# Patient Record
Sex: Female | Born: 1950
Health system: Southern US, Community
[De-identification: ages and names within clinical notes are randomized; demographics above are authoritative.]

## PROBLEM LIST (undated history)

## (undated) DIAGNOSIS — R51 Headache: Secondary | ICD-10-CM

## (undated) DIAGNOSIS — I10 Essential (primary) hypertension: Secondary | ICD-10-CM

## (undated) DIAGNOSIS — D126 Benign neoplasm of colon, unspecified: Secondary | ICD-10-CM

## (undated) DIAGNOSIS — K6289 Other specified diseases of anus and rectum: Secondary | ICD-10-CM

## (undated) DIAGNOSIS — G6289 Other specified polyneuropathies: Secondary | ICD-10-CM

## (undated) DIAGNOSIS — K5790 Diverticulosis of intestine, part unspecified, without perforation or abscess without bleeding: Secondary | ICD-10-CM

## (undated) DIAGNOSIS — T7840XA Allergy, unspecified, initial encounter: Secondary | ICD-10-CM

## (undated) DIAGNOSIS — E785 Hyperlipidemia, unspecified: Secondary | ICD-10-CM

## (undated) HISTORY — DX: Headache: R51

## (undated) HISTORY — DX: Hyperlipidemia, unspecified: E78.5

## (undated) HISTORY — DX: Diverticulosis of intestine, part unspecified, without perforation or abscess without bleeding: K57.90

## (undated) HISTORY — DX: Allergy, unspecified, initial encounter: T78.40XA

## (undated) HISTORY — DX: Other specified diseases of anus and rectum: K62.89

## (undated) HISTORY — DX: Benign neoplasm of colon, unspecified: D12.6

## (undated) HISTORY — DX: Essential (primary) hypertension: I10

---
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
## 1998-06-29 ENCOUNTER — Emergency Department (HOSPITAL_COMMUNITY): Admission: EM | Admit: 1998-06-29 | Discharge: 1998-06-29 | Payer: Self-pay | Admitting: Emergency Medicine

## 1998-07-08 DIAGNOSIS — K6289 Other specified diseases of anus and rectum: Secondary | ICD-10-CM

## 1998-07-08 HISTORY — DX: Other specified diseases of anus and rectum: K62.89

## 1999-08-03 ENCOUNTER — Other Ambulatory Visit: Admission: RE | Admit: 1999-08-03 | Discharge: 1999-08-03 | Payer: Self-pay

## 2000-10-06 ENCOUNTER — Other Ambulatory Visit: Admission: RE | Admit: 2000-10-06 | Discharge: 2000-10-06 | Payer: Self-pay | Admitting: Obstetrics and Gynecology

## 2002-11-01 ENCOUNTER — Other Ambulatory Visit: Admission: RE | Admit: 2002-11-01 | Discharge: 2002-11-01 | Payer: Self-pay | Admitting: Obstetrics and Gynecology

## 2003-11-04 ENCOUNTER — Other Ambulatory Visit: Admission: RE | Admit: 2003-11-04 | Discharge: 2003-11-04 | Payer: Self-pay | Admitting: Obstetrics and Gynecology

## 2004-10-12 ENCOUNTER — Encounter: Admission: RE | Admit: 2004-10-12 | Discharge: 2004-10-12 | Payer: Self-pay | Admitting: Family Medicine

## 2004-12-14 ENCOUNTER — Encounter: Admission: RE | Admit: 2004-12-14 | Discharge: 2004-12-14 | Payer: Self-pay | Admitting: Neurology

## 2005-01-10 ENCOUNTER — Other Ambulatory Visit: Admission: RE | Admit: 2005-01-10 | Discharge: 2005-01-10 | Payer: Self-pay | Admitting: Obstetrics and Gynecology

## 2005-01-13 ENCOUNTER — Encounter: Admission: RE | Admit: 2005-01-13 | Discharge: 2005-01-13 | Payer: Self-pay | Admitting: Neurology

## 2005-06-21 ENCOUNTER — Ambulatory Visit: Payer: Self-pay | Admitting: Internal Medicine

## 2005-08-02 ENCOUNTER — Ambulatory Visit: Payer: Self-pay | Admitting: Internal Medicine

## 2005-08-12 ENCOUNTER — Ambulatory Visit: Payer: Self-pay | Admitting: Internal Medicine

## 2005-11-23 ENCOUNTER — Encounter: Admission: RE | Admit: 2005-11-23 | Discharge: 2005-12-15 | Payer: Self-pay | Admitting: Neurology

## 2007-10-04 HISTORY — PX: ABDOMINAL HYSTERECTOMY: SHX81

## 2007-10-04 HISTORY — PX: BLADDER SURGERY: SHX569

## 2008-08-20 ENCOUNTER — Ambulatory Visit: Payer: Self-pay | Admitting: Sports Medicine

## 2008-08-20 DIAGNOSIS — M5137 Other intervertebral disc degeneration, lumbosacral region: Secondary | ICD-10-CM | POA: Insufficient documentation

## 2008-08-20 DIAGNOSIS — M51379 Other intervertebral disc degeneration, lumbosacral region without mention of lumbar back pain or lower extremity pain: Secondary | ICD-10-CM | POA: Insufficient documentation

## 2008-08-20 DIAGNOSIS — M79609 Pain in unspecified limb: Secondary | ICD-10-CM | POA: Insufficient documentation

## 2008-08-20 DIAGNOSIS — M5430 Sciatica, unspecified side: Secondary | ICD-10-CM | POA: Insufficient documentation

## 2008-08-21 ENCOUNTER — Encounter: Payer: Self-pay | Admitting: Sports Medicine

## 2008-09-16 ENCOUNTER — Ambulatory Visit: Payer: Self-pay | Admitting: Sports Medicine

## 2008-09-16 DIAGNOSIS — M25519 Pain in unspecified shoulder: Secondary | ICD-10-CM | POA: Insufficient documentation

## 2008-10-09 ENCOUNTER — Telehealth (INDEPENDENT_AMBULATORY_CARE_PROVIDER_SITE_OTHER): Payer: Self-pay | Admitting: *Deleted

## 2008-10-30 ENCOUNTER — Ambulatory Visit: Payer: Self-pay | Admitting: Sports Medicine

## 2009-04-08 ENCOUNTER — Ambulatory Visit (HOSPITAL_COMMUNITY): Admission: RE | Admit: 2009-04-08 | Discharge: 2009-04-09 | Payer: Self-pay | Admitting: Obstetrics and Gynecology

## 2009-04-08 ENCOUNTER — Encounter (INDEPENDENT_AMBULATORY_CARE_PROVIDER_SITE_OTHER): Payer: Self-pay | Admitting: Obstetrics and Gynecology

## 2010-06-29 ENCOUNTER — Encounter (INDEPENDENT_AMBULATORY_CARE_PROVIDER_SITE_OTHER): Payer: Self-pay | Admitting: *Deleted

## 2010-10-24 ENCOUNTER — Encounter: Payer: Self-pay | Admitting: Neurology

## 2010-11-02 NOTE — Letter (Signed)
Summary: Colonoscopy Date Change Letter  [facility]  [address] [place], [place] [address]   Phone: [phone]  Fax: [phone]      June 29, 2010 MRN: [id number]   [facility] [address] [place], [place]  [address]   Dear Ms. Herrero,   Previously you were recommended to have a repeat colonoscopy around this time. Your chart was recently reviewed by Dr. Lina Sar of [facility]. Follow up colonoscopy is now recommended in November 2013. This revised recommendation is based on current, nationally recognized guidelines for colorectal cancer screening and polyp surveillance. These guidelines are endorsed by the American Cancer Society, [employer] on Colorectal Cancer as well as numerous other major medical organizations.  Please understand that our recommendation assumes that you do not have any new symptoms such as bleeding, a change in bowel habits, anemia, or significant abdominal discomfort. If you do have any concerning GI symptoms or want to discuss the guideline recommendations, please call to arrange an office visit at your earliest convenience. Otherwise we will keep you in our reminder system and contact you 1-2 months prior to the date listed above to schedule your next colonoscopy.  Thank you,  Hedwig Morton. Juanda Chance, M.D.  [facility] [phone]

## 2011-01-09 LAB — CBC
HCT: 45.2 % (ref 36.0–46.0)
Hemoglobin: 15.7 g/dL — ABNORMAL HIGH (ref 12.0–15.0)
MCHC: 34.5 g/dL (ref 30.0–36.0)
MCHC: 34.7 g/dL (ref 30.0–36.0)
MCV: 88 fL (ref 78.0–100.0)
MCV: 89.4 fL (ref 78.0–100.0)
RBC: 5.13 MIL/uL — ABNORMAL HIGH (ref 3.87–5.11)
RDW: 12.4 % (ref 11.5–15.5)

## 2011-01-09 LAB — BASIC METABOLIC PANEL
CO2: 28 mEq/L (ref 19–32)
CO2: 29 mEq/L (ref 19–32)
Calcium: 9 mg/dL (ref 8.4–10.5)
Chloride: 105 mEq/L (ref 96–112)
Chloride: 97 mEq/L (ref 96–112)
Creatinine, Ser: 0.79 mg/dL (ref 0.4–1.2)
Creatinine, Ser: 0.86 mg/dL (ref 0.4–1.2)
GFR calc Af Amer: >60 mL/min (ref >60)
Glucose, Bld: 117 mg/dL — ABNORMAL HIGH (ref 70–99)
Potassium: 3.3 mEq/L — ABNORMAL LOW (ref 3.5–5.1)
Sodium: 134 mEq/L — ABNORMAL LOW (ref 135–145)

## 2011-02-15 NOTE — Op Note (Signed)
NAMEJOURY, ALLCORN               ACCOUNT NO.:  [id number]   MEDICAL RECORD NO.:  [id number]          PATIENT TYPE:  INP   LOCATION:  9315                          FACILITY:  [facility]   PHYSICIAN:  Malva Limes, M.D.    DATE OF BIRTH:  05-Mar-1951   DATE OF PROCEDURE:  04/08/2009  DATE OF DISCHARGE:                               OPERATIVE REPORT   PREOPERATIVE DIAGNOSES:  1. Uterine prolapse.  2. Uterine fibroids.  3. Symptomatic cystocele and rectocele.  4. Stress urinary incontinence.   POSTOPERATIVE DIAGNOSES:  1. Uterine prolapse.  2. Uterine fibroids.  3. Symptomatic cystocele and rectocele.  4. Stress urinary incontinence.   PROCEDURE:  Laparoscopic-assisted total vaginal hysterectomy with  bilateral salpingo-oophorectomy, TVT sling placement, cystoscopy,  anterior and posterior colporrhaphy.   SURGEON:  Malva Limes, MD   ASSISTANT:  Edward Jolly.   ANESTHESIA:  General endotracheal.   ANTIBIOTICS:  Cefotan 1 g.   ESTIMATED BLOOD LOSS:  200 mL.   COMPLICATIONS:  None.   SPECIMENS:  Uterus, cervix, fallopian tubes, and ovaries sent to  Pathology.   DRAINS:  Foley at bedside drainage.   PROCEDURE:  The patient was taken to the operating room where general  anesthetic was administered without difficulty.  The patient was then  placed in the dorsal lithotomy position.  She was prepped and draped in  the usual fashion for this procedure.  A Hulka tenaculum was applied to  the anterior cervical lip.  At this point, the umbilicus was injected  with 0.25% Marcaine.  A vertical skin incision was made.  The fascia was  then grasped and entered with the Mayo scissors.  Parietal peritoneum  was entered bluntly.  A 0 Vicryl suture was placed in the pursestring  fashion.  The Hasson cannula was placed into the abdominal cavity.  A 3  L of carbon dioxide was then insufflated.  The patient was placed in  Trendelenburg, and the scope placed.  A 5-mm port was placed in the  right and  left lower quadrant under direct visualization.  At this  point, an examination revealed normal-appearing bowel.  The patient had  no evidence of any endometriosis or adhesions.  The patient did have a  multilobulated uterus consistent with uterine fibroids.  Both ovaries  were normal bilaterally.  At this point, the left ureter was identified.  The infundibulopelvic ligament was then grasped, cauterized, and  transected with the Gyrus device.  The round ligament was cauterized and  transected, and then the broad ligament was cauterized and transected  down to the level of the uterine vessel.  The bladder flap was then  opened.  Similar procedure was performed in the opposite side.  Once  this was completed, the attention was turned to the vagina.  The cervix  was injected with 0.25% Marcaine.  The posterior cul-de-sac was entered  sharply.  The uterosacral ligaments were bilaterally clamped, cut, and  ligated with 0 Monocryl suture.  The cervix was then circumscribed.  The  anterior cul-de-sac was entered sharply.  The cardinal ligaments were  bilaterally clamped, cut, and ligated  with 0 Monocryl suture.  The  uterine vessels were bilaterally clamped, cut, and ligated with 0  Monocryl suture.  The uterus was then inverted, and the remaining  pedicles clamped, cut, and ligated with 0 Monocryl suture.  The uterus,  fallopian tubes, and ovaries were then removed.  All pedicles were then  checked and felt to be hemostatic.  At this point, the posterior cuff  was closed using 2-0 Vicryl in a running locking fashion.  At this  point, Dr. Edward Jolly took over the case, and she performed the TVT sling  placement, cystoscopy, anterior and posterior colporrhaphy.  Once these  procedures were completed, attention was again turned to the  laparoscopy.  The abdomen was reinsufflated, and no evidence of any  bleeding noted.  The scope was then removed.  The pneumoperitoneum  released.  The fascia was closed  with 0 Monocryl suture, the skin with 4-  0 Vicryl suture.  The 5-mm ports were closed with Dermabond.  The  patient was extubated and taken to the recovery room in stable  condition.  Instrument and lap counts were correct x2.   For complete description of the procedure performed by Dr. Edward Jolly, please  see her dictated operative note.           ______________________________  Malva Limes, M.D.     MA/MEDQ  D:  04/08/2009  T:  04/09/2009  Job:  [id number]

## 2011-02-15 NOTE — Op Note (Signed)
[name], [name]               ACCOUNT NO.:  [id number]   MEDICAL RECORD NO.:  [id number]          PATIENT TYPE:  INP   LOCATION:  9315                          FACILITY:  [facility]   PHYSICIAN:  [name], M.D.   DATE OF BIRTH:  03-13-1951   DATE OF PROCEDURE:  04/08/2009  DATE OF DISCHARGE:                               OPERATIVE REPORT   PREOPERATIVE DIAGNOSES:  1. Incomplete uterovaginal prolapse.  2. Genuine stress incontinence.   POSTOPERATIVE DIAGNOSES:  1. Incomplete uterovaginal prolapse.  2. Genuine stress incontinence.   SURGEON:  Anna Lobo, MD   ASSISTANT:  Anna Limes, MD   PROCEDURE:  Durwin Nora, tension-free vaginal tape, mid-urethral  sling, cystoscopy, anterior and posterior colporrhaphy.   ANESTHESIA:  General endotracheal, local with 1% lidocaine with  epinephrine 1:100,000.   TOTAL URINARY OUTPUT FOR PROCEDURE:  800 mL.   TOTAL ESTIMATED BLOOD LOSS:  200 mL.   TOTAL IV FLUIDS:  2800 mL.   COMPLICATIONS:  None.   INDICATIONS FOR PROCEDURE:  The patient is a 60 year old gravida 2, para  1-0-1-1 Caucasian female referred by Dr. Malva Le for evaluation  and treatment of pelvic organ prolapse and urinary incontinence.  The  patient in the office was noted to have a third-degree cystocele, first-  degree uterine prolapse, and first to second-degree rectocele.  The  patient reported urinary loss with sneezing, coughing, and stooping.  The the patient had multichannel urodynamic testing in the office that  did not show genuine stress incontinence.  The patient has requested  surgery for both prolapse and the potential genuine stress incontinence.  A plan is now made to proceed with a laparoscopically-assisted vaginal  hysterectomy with bilateral salpingo-oophorectomy by Dr. Dareen Le and to  then proceed with a TVT midurethral sling, cystoscopy, and anterior and  posterior colporrhaphy by me.  Risks, benefits, and alternatives have  been reviewed with the patient who wishes to proceed.   FINDINGS:  Examination under anesthesia revealed a third-degree  cystocele, first-degree uterine prolapse, and a first to second-degree  rectocele.  Cystoscopy demonstrated the bladder to be normal throughout  360 degrees including the bladder dome and trigone.  The ureters were  patent bilaterally.  There was no evidence of foreign body in the  bladder or the urethra.   SPECIMENS:  None.   DESCRIPTION OF PROCEDURE:  The patient was reidentified in the  preoperative hold area.  She did receive cefotetan 1 g IV for antibiotic  prophylaxis and compression stockings for deep vein thrombosis  prophylaxis.   In the operating room, the patient received general endotracheal  anesthesia and was then placed in the dorsal lithotomy position.  Dr.  Dareen Le prepped and draped the patient and placed a Foley catheter and  then proceeded with a laparoscopically-assisted vaginal hysterectomy  with bilateral salpingo-oophorectomy.  Please refer to this dictation  separately.   At the termination of the hysterectomy, Dr. Dareen Le had placed a  running locked suture in the posterior vaginal cuff.  The pedicles were  noted to be hemostatic.   I then took over at this  time and performed a McCall culdoplasty suture.  I used a 0 Vicryl suture to come through the vaginal cuff and into the  posterior cul-de-sac at the 6 o'clock position.  The suture was brought  down through the distal left uterosacral ligament, across the posterior  cul-de-sac in a pursestring fashion, down through the distal right  uterosacral ligament and then out through the cul-de-sac and into the  vagina at the 6 o'clock position.   The anterior colporrhaphy was performed next.  Allis clamps were used to  mark the midline of the anterior vaginal mucosa which was injected  locally with 1% lidocaine with epinephrine 1:100,000.  The vaginal  mucosa was then incised vertically  in the midline.  The subvaginal  tissue and bladder were dissected off the overlying vaginal mucosa  bilaterally.  The dissection was carried back to the pubic rami and then  down to the level of the uterosacral ligament sutures.   The sling was performed next.  Two 1 cm suprapubic incisions were  created sharply with a scalpel.  They were placed 2 cm to the right and  left in the midline.  The sling was performed in a top-down fashion.   An abdominal needle passer was placed through the right suprapubic  incision and then out through the endopelvic fascia in the vagina  lateral to the mid urethra on the ipsilateral side.  The same procedure  that was performed on the right was then repeated on the left-hand side.  The Foley catheter was taken out at this time and cystoscopy was  performed and there was no evidence of a foreign body in either the  bladder or the urethra.  The left ureter was noted to be patent after  the injection of indigo carmine dye IV and then 10 mg of Lasix.  The  Lasix was used as there was a significant delay in the appearance of the  indigo carmine dye.  The right ureter did not demonstrate patency at  this time and the bladder was therefore drained and the sling procedure  was continuedto give additional time for the right ureter to demonstrate  function.  The Foley catheter was replaced.  The sling was attached to  the abdominal needle passers and was drawn up through the suprapubic  incisions bilaterally.  The plastic sheaths were separated from the  surrounding sling.  A Kelly clamp was placed between the urethra and the  sling as the plastic sheaths were then removed.  Excess sling was  trimmed suprapubically.  The sling was noted to be in excellent  condition.   The Foley catheter was again removed and cystoscopy was performed again,  and this time there was patency demonstrated of the right ureter.  The  cystoscopic fluid was therefore drained and the  Foley catheter was  replaced and left to continuous drainage throughout the remainder of the  procedure.   The anterior colporrhaphy was performed by placing vertical mattress  sutures of 2-0 Vicryl for reduction of the cystocele.   Excess vaginal mucosa was then trimmed and the anterior vaginal wall was  closed with a running locked suture of 2-0 Vicryl.  The vaginal cuff was  then closed with a running locked suture of 0 Vicryl.  Hemostasis was  good.   The posterior colporrhaphy was performed last.  Allis clamps were used  to mark the introitus and the posterior vaginal mucosa.  The perineal  body and the posterior vaginal mucosa were  then injected locally with 1%  lidocaine with epinephrine 1:100,000.  A triangular wedge of epithelium  was excised from the perineal body and then the posterior vaginal mucosa  was incised vertically in the midline.  The perirectal fascia was  dissected off the overlying vaginal mucosa.  Hemostasis was created with  monopolar cautery.  The colporrhaphy was performed next.  The top suture  was a pursestring suture that did involve the vaginal mucosa in the  midline.  The other colporrhaphy stitches were vertical mattress sutures  of 2-0 Vicryl, which transitioned down to 0 Vicryl at the perineal body.  Hemostasis was created also with monopolar cautery.  Excess vaginal  mucosa was trimmed and posterior vaginal wall was then closed with a  running locked suture of 2-0 Vicryl which continued along the perineal  body in a subcuticular fashion as for an episiotomy repair.  One single  through-and-through suture of the perineal body, epithelium was placed  for closure of the epithelium in one small location.   The culdoplasty suture was tied and there was excellent support and  elevation of the vaginal cuff.   The suprapubic incisions were closed with Dermabond.  A vaginal packing  with Estrace cream was placed inside the vagina.   Final rectal exam  confirmed the absence of sutures in the rectum.   At this time, Dr. Dareen Le, performed final laparoscopy, which again was  dictated separately.   This concluded the patient's procedure.  There were no complications.  All needle, instrument, and sponge counts were correct.  The patient was  extubated and escorted to the recovery room in stable and awake  condition.      [name], M.D.  Electronically Signed     BES/MEDQ  D:  04/08/2009  T:  04/09/2009  Job:  [id number]

## 2011-08-09 ENCOUNTER — Encounter: Payer: Self-pay | Admitting: Internal Medicine

## 2011-08-09 ENCOUNTER — Telehealth: Payer: Self-pay | Admitting: Internal Medicine

## 2011-08-09 MED ORDER — DICYCLOMINE HCL 10 MG PO CAPS: ORAL_CAPSULE | ORAL | Status: DC

## 2011-08-09 MED ORDER — ALIGN 4 MG PO CAPS: 4.0000 mg | ORAL_CAPSULE | Freq: Every day | ORAL | Status: DC

## 2011-08-09 NOTE — Telephone Encounter (Signed)
Spoke with patient and gave her Dr. Regino Schultze recommendations. Samples for Align up front for pick up. Rx sent to pharmacy

## 2011-08-09 NOTE — Telephone Encounter (Signed)
Please start Align or a probiotic 1 po qd, and Bentyl 10 mg po bid, #30, till she sees me.

## 2011-08-09 NOTE — Telephone Encounter (Signed)
Patient calling to report for the last 6 weeks, she has had intermittent bloating,belching and sore stomach. She states"my stomach feels like a tight balloon." She did have loose stool yesterday and had pink tinged mucous on tissue when wiped. She reports intermittent constipation during the last 6 weeks also. She tried Gas relief pills last night without relief. Scheduled patient on 08/29/11 at 8:45 AM for OV per her request. Hx adenomatous polyp 1999. Last colon- 08/12/2005- normal, diverticulosis.

## 2011-08-19 ENCOUNTER — Encounter: Payer: Self-pay | Admitting: Internal Medicine

## 2011-08-29 ENCOUNTER — Ambulatory Visit (INDEPENDENT_AMBULATORY_CARE_PROVIDER_SITE_OTHER): Payer: BC Managed Care – PPO | Admitting: Internal Medicine

## 2011-08-29 ENCOUNTER — Encounter: Payer: Self-pay | Admitting: Internal Medicine

## 2011-08-29 DIAGNOSIS — R141 Gas pain: Secondary | ICD-10-CM

## 2011-08-29 DIAGNOSIS — R140 Abdominal distension (gaseous): Secondary | ICD-10-CM

## 2011-08-29 DIAGNOSIS — K589 Irritable bowel syndrome without diarrhea: Secondary | ICD-10-CM

## 2011-08-29 NOTE — Progress Notes (Signed)
[name] [birth year] MRN [id number]    History of Present Illness:  This is a 60 year old white female with bloating, flatus and irregular bowel habits alternating between diarrhea, constipation and irregular bowel movements. She has, on occasion, noted pinkish blood on the toilet tissue. She gets minimal exercise, walking her dog every day but most of the time sits at the computer. She eats a moderate amount fiber. Her weight has been stable. A colonoscopy in 1999 showed an adenomatous polyp. Her last colonoscopy in November 2006 showed moderate diverticulosis of the left colon. She called our office several weeks ago and we started her on probiotics. Her symptoms have resolved. There is a positive family history of gallbladder disease in her sister and brother. She underwent a total abdominal hysterectomy with bilateral salpingo-oophorectomy 2 years ago.for prolapsed bladder.   Past Medical History  Diagnosis Date  . Adenomatous colon polyp   . Hypertension   . Hyperlipidemia   . Diverticulosis   . Headache   . Proctitis 07/08/1998  . Constipation    Past Surgical History  Procedure Date  . Abdominal hysterectomy   . Bladder surgery     Tack    reports that she has never smoked. She has never used smokeless tobacco. She reports that she drinks alcohol. She reports that she does not use illicit drugs. family history includes Cervical cancer in her maternal grandmother and Irritable bowel syndrome in her brother and mother.  There is no history of Colon cancer. Allergies  Allergen Reactions  . Septra (Bactrim)         Review of Systems: Denies heartburn but has had some belching denies food intolerance. Weight has been stable. Positive for occasional abdominal distention and change in bowel habits  The remainder of the 10 point ROS is negative except as outlined in H&P   Physical Exam: General appearance  Well developed, in no distress. Eyes- non icteric. HEENT  nontraumatic, normocephalic. Mouth no lesions, tongue papillated, no cheilosis. Neck supple without adenopathy, thyroid not enlarged, no carotid bruits, no JVD. Lungs Clear to auscultation bilaterally. Cor normal S1, normal S2, regular rhythm, no murmur,  quiet precordium. Abdomen: Soft nontender abdomen with normal active bowel sounds with minimal discomfort in the left lower quadrant. No palpable mass. Liver edge at costal margin. Rectal: Soft Hemoccult negative stool. Extremities no pedal edema. Skin no lesions. Neurological alert and oriented x 3. Psychological normal mood and affect.  Assessment and Plan:  Problem #1 Nonspecific symptoms of bloating and flatulence consistent with irritable bowel syndrome. This could also possibly be bacterial overgrowth or symptomatic gallbladder disease. We will proceed with an upper abdominal ultrasound. She will continue on probiotics. She will be due for a colonoscopy this year or next year depending on the results of the ultrasound. I have encouraged her to do more exercise and increase the fiber content of her food.   08/29/2011 Lina Sar

## 2011-08-29 NOTE — Patient Instructions (Signed)
You have been scheduled for an abdominal ultrasound at [facility] (1st floor of hospital) on Thursday, 09/01/11 at 8:00 am. Please arrive 15 minutes prior to your appointment for registration. Make certain not to have anything to eat or drink 6 hours prior to your appointment. Should you need to reschedule your appointment, please contact radiology at [phone]. You will be due for a recall colonoscopy in January 2013. We will send you a reminder in the mail when it gets closer to that time. CC: Dr Loyal Jacobson

## 2011-09-01 ENCOUNTER — Other Ambulatory Visit (HOSPITAL_COMMUNITY): Payer: BC Managed Care – PPO

## 2011-09-20 ENCOUNTER — Ambulatory Visit: Payer: Self-pay | Admitting: Internal Medicine

## 2012-06-14 ENCOUNTER — Telehealth: Payer: Self-pay | Admitting: Internal Medicine

## 2012-06-14 DIAGNOSIS — R140 Abdominal distension (gaseous): Secondary | ICD-10-CM

## 2012-06-14 NOTE — Telephone Encounter (Signed)
Patient calling in with c/o constipation and bloating x 2 weeks. States she has not had a normal bowel movement, just little hard balls for 2 weeks. She is using stool softeners. Suggested she try Miralax  BID to TID or Magnesium cirtate 1/2 bottle and repeat if no results. She will try the Miralax. Patient states she cancelled the ultrasound of abdomen ordered in her OV on 08/2011. She is asking if she should have that done now. Last OV 08/29/11- ?IBS, bacterial overgrowth or gallbladder disease.

## 2012-06-15 NOTE — Telephone Encounter (Signed)
I have reviewed my note from 09/2011, I agree with rescheduling her ultrasound, but she has to keep her appointment. If negative, she is due for colonoscopy before the end of this year- hx of adenomatous polyps. I agree with Miralax

## 2012-06-18 NOTE — Telephone Encounter (Signed)
Scheduled ultrasound of abdomen at [facility] radiology on 06/20/12 at 8:30 AM. NPO after midnight. Scheduled OV on 07/03/12 at 9:00/9:15 AM.Left a message for patient to call me.

## 2012-06-18 NOTE — Telephone Encounter (Signed)
Spoke with patient and gave her Dr. Regino Schultze recommendation and appointments/instructions.

## 2012-06-20 ENCOUNTER — Ambulatory Visit (HOSPITAL_COMMUNITY)
Admission: RE | Admit: 2012-06-20 | Discharge: 2012-06-20 | Disposition: A | Discharge status: Home / Self Care | Payer: BC Managed Care – PPO | Source: Ambulatory Visit | Attending: Internal Medicine | Admitting: Internal Medicine

## 2012-06-20 DIAGNOSIS — R109 Unspecified abdominal pain: Secondary | ICD-10-CM | POA: Insufficient documentation

## 2012-06-20 DIAGNOSIS — R142 Eructation: Secondary | ICD-10-CM | POA: Insufficient documentation

## 2012-06-20 DIAGNOSIS — R141 Gas pain: Secondary | ICD-10-CM | POA: Insufficient documentation

## 2012-06-20 DIAGNOSIS — R140 Abdominal distension (gaseous): Secondary | ICD-10-CM

## 2012-06-22 ENCOUNTER — Other Ambulatory Visit: Payer: Self-pay | Admitting: *Deleted

## 2012-06-22 ENCOUNTER — Encounter: Payer: Self-pay | Admitting: *Deleted

## 2012-06-22 MED ORDER — HYOSCYAMINE SULFATE 0.125 MG SL SUBL: SUBLINGUAL_TABLET | SUBLINGUAL | Status: DC

## 2012-06-22 NOTE — Telephone Encounter (Signed)
error 

## 2012-06-29 ENCOUNTER — Encounter: Payer: Self-pay | Admitting: Internal Medicine

## 2012-07-03 ENCOUNTER — Ambulatory Visit: Payer: BC Managed Care – PPO | Admitting: Internal Medicine

## 2012-07-09 ENCOUNTER — Encounter: Payer: Self-pay | Admitting: Internal Medicine

## 2012-08-24 ENCOUNTER — Encounter: Payer: Self-pay | Admitting: Internal Medicine

## 2012-08-24 ENCOUNTER — Ambulatory Visit (AMBULATORY_SURGERY_CENTER): Payer: BC Managed Care – PPO | Admitting: *Deleted

## 2012-08-24 VITALS — Ht 67.0 in | Wt 145.0 lb

## 2012-08-24 DIAGNOSIS — Z1211 Encounter for screening for malignant neoplasm of colon: Secondary | ICD-10-CM

## 2012-08-24 MED ORDER — MOVIPREP 100 G PO SOLR: ORAL | Status: DC

## 2012-09-07 ENCOUNTER — Ambulatory Visit (AMBULATORY_SURGERY_CENTER): Payer: BC Managed Care – PPO | Admitting: Internal Medicine

## 2012-09-07 ENCOUNTER — Encounter: Payer: Self-pay | Admitting: Internal Medicine

## 2012-09-07 VITALS — BP 109/75 | HR 60 | Temp 98.2°F | Resp 15 | Ht 67.0 in | Wt 145.0 lb

## 2012-09-07 DIAGNOSIS — Z1211 Encounter for screening for malignant neoplasm of colon: Secondary | ICD-10-CM

## 2012-09-07 DIAGNOSIS — Z86010 Personal history of colonic polyps: Secondary | ICD-10-CM

## 2012-09-07 MED ORDER — SODIUM CHLORIDE 0.9 % IV SOLN: 500.0000 mL | INTRAVENOUS | Status: DC

## 2012-09-07 NOTE — Progress Notes (Signed)
Patient did not experience any of the following events: a burn prior to discharge; a fall within the facility; wrong site/side/patient/procedure/implant event; or a hospital transfer or hospital admission upon discharge from the facility. (G8907) Patient did not have preoperative order for IV antibiotic SSI prophylaxis. (G8918)  

## 2012-09-07 NOTE — Op Note (Signed)
[facility] [address] [place], [address]   COLONOSCOPY PROCEDURE REPORT  PATIENT: [name], [name]  MR#: [id number] BIRTHDATE: 1951-08-18 , 60  yrs. old GENDER: Female ENDOSCOPIST: Hart Carwin, MD REFERRED BY:  Loyal Jacobson, M.D. PROCEDURE DATE:  09/07/2012 PROCEDURE:   Colonoscopy, surveillance ASA CLASS:   Class II INDICATIONS:Patient's personal history of adenomatous colon polyps and adenomatous polyp 1999, no polyp 2006. MEDICATIONS: MAC sedation, administered by CRNA and propofol (Diprivan) 200mg  IV  DESCRIPTION OF PROCEDURE:   After the risks and benefits and of the procedure were explained, informed consent was obtained.  A digital rectal exam revealed no abnormalities of the rectum.    The LB [id number]  endoscope was introduced through the anus and advanced to the cecum, which was identified by both the appendix and ileocecal valve .  The quality of the prep was excellent, using MoviPrep .  The instrument was then slowly withdrawn as the colon was fully examined.     COLON FINDINGS: Moderately severe diverticulosi of the sigmoid colon Hypertrophied folds, narrow lumen.     Retroflexed views revealed no abnormalities.     The scope was then withdrawn from the patient and the procedure completed.  COMPLICATIONS: There were no complications. ENDOSCOPIC IMPRESSION: Moderately severe diverticulosis of the sigmoid colon, Hypertrophied folds, narrow lumen , no obstruction  RECOMMENDATIONS: High fiber diet add solyuable fiber- Metamucil 1 tsp daily   REPEAT EXAM: In 10 year(s)  for Colonoscopy.  cc:  _______________________________ eSignedHart Carwin, MD 09/07/2012 10:10 AM     PATIENT NAME:  [name], [name] MR#: [id number]

## 2012-09-07 NOTE — Patient Instructions (Addendum)

## 2012-09-10 ENCOUNTER — Telehealth: Payer: Self-pay | Admitting: *Deleted

## 2012-09-10 NOTE — Telephone Encounter (Signed)
  Follow up Call-  Call back number 09/07/2012  Post procedure Call Back phone  # [phone]  Permission to leave phone message Yes     No answer, left message!!

## 2015-10-19 DIAGNOSIS — M5412 Radiculopathy, cervical region: Secondary | ICD-10-CM | POA: Diagnosis not present

## 2015-10-19 DIAGNOSIS — M9901 Segmental and somatic dysfunction of cervical region: Secondary | ICD-10-CM | POA: Diagnosis not present

## 2015-10-19 DIAGNOSIS — M50322 Other cervical disc degeneration at C5-C6 level: Secondary | ICD-10-CM | POA: Diagnosis not present

## 2015-10-19 DIAGNOSIS — M9902 Segmental and somatic dysfunction of thoracic region: Secondary | ICD-10-CM | POA: Diagnosis not present

## 2015-10-19 DIAGNOSIS — M5031 Other cervical disc degeneration,  high cervical region: Secondary | ICD-10-CM | POA: Diagnosis not present

## 2015-10-19 DIAGNOSIS — M50323 Other cervical disc degeneration at C6-C7 level: Secondary | ICD-10-CM | POA: Diagnosis not present

## 2015-10-19 DIAGNOSIS — M50321 Other cervical disc degeneration at C4-C5 level: Secondary | ICD-10-CM | POA: Diagnosis not present

## 2015-10-23 DIAGNOSIS — M50321 Other cervical disc degeneration at C4-C5 level: Secondary | ICD-10-CM | POA: Diagnosis not present

## 2015-10-23 DIAGNOSIS — M9901 Segmental and somatic dysfunction of cervical region: Secondary | ICD-10-CM | POA: Diagnosis not present

## 2015-10-23 DIAGNOSIS — M5412 Radiculopathy, cervical region: Secondary | ICD-10-CM | POA: Diagnosis not present

## 2015-10-23 DIAGNOSIS — M9902 Segmental and somatic dysfunction of thoracic region: Secondary | ICD-10-CM | POA: Diagnosis not present

## 2015-10-23 DIAGNOSIS — M5031 Other cervical disc degeneration,  high cervical region: Secondary | ICD-10-CM | POA: Diagnosis not present

## 2015-10-23 DIAGNOSIS — M50323 Other cervical disc degeneration at C6-C7 level: Secondary | ICD-10-CM | POA: Diagnosis not present

## 2015-10-23 DIAGNOSIS — M50322 Other cervical disc degeneration at C5-C6 level: Secondary | ICD-10-CM | POA: Diagnosis not present

## 2015-10-27 DIAGNOSIS — M5031 Other cervical disc degeneration,  high cervical region: Secondary | ICD-10-CM | POA: Diagnosis not present

## 2015-10-27 DIAGNOSIS — M50321 Other cervical disc degeneration at C4-C5 level: Secondary | ICD-10-CM | POA: Diagnosis not present

## 2015-10-27 DIAGNOSIS — M5412 Radiculopathy, cervical region: Secondary | ICD-10-CM | POA: Diagnosis not present

## 2015-10-27 DIAGNOSIS — M9902 Segmental and somatic dysfunction of thoracic region: Secondary | ICD-10-CM | POA: Diagnosis not present

## 2015-10-27 DIAGNOSIS — M50322 Other cervical disc degeneration at C5-C6 level: Secondary | ICD-10-CM | POA: Diagnosis not present

## 2015-10-27 DIAGNOSIS — M9901 Segmental and somatic dysfunction of cervical region: Secondary | ICD-10-CM | POA: Diagnosis not present

## 2015-10-27 DIAGNOSIS — M50323 Other cervical disc degeneration at C6-C7 level: Secondary | ICD-10-CM | POA: Diagnosis not present

## 2015-10-29 DIAGNOSIS — M79671 Pain in right foot: Secondary | ICD-10-CM | POA: Diagnosis not present

## 2015-10-29 DIAGNOSIS — G8929 Other chronic pain: Secondary | ICD-10-CM | POA: Diagnosis not present

## 2015-10-30 DIAGNOSIS — M50323 Other cervical disc degeneration at C6-C7 level: Secondary | ICD-10-CM | POA: Diagnosis not present

## 2015-10-30 DIAGNOSIS — M9901 Segmental and somatic dysfunction of cervical region: Secondary | ICD-10-CM | POA: Diagnosis not present

## 2015-10-30 DIAGNOSIS — M5031 Other cervical disc degeneration,  high cervical region: Secondary | ICD-10-CM | POA: Diagnosis not present

## 2015-10-30 DIAGNOSIS — M5412 Radiculopathy, cervical region: Secondary | ICD-10-CM | POA: Diagnosis not present

## 2015-10-30 DIAGNOSIS — M50321 Other cervical disc degeneration at C4-C5 level: Secondary | ICD-10-CM | POA: Diagnosis not present

## 2015-10-30 DIAGNOSIS — M9902 Segmental and somatic dysfunction of thoracic region: Secondary | ICD-10-CM | POA: Diagnosis not present

## 2015-10-30 DIAGNOSIS — M50322 Other cervical disc degeneration at C5-C6 level: Secondary | ICD-10-CM | POA: Diagnosis not present

## 2015-11-02 DIAGNOSIS — M50321 Other cervical disc degeneration at C4-C5 level: Secondary | ICD-10-CM | POA: Diagnosis not present

## 2015-11-02 DIAGNOSIS — M50323 Other cervical disc degeneration at C6-C7 level: Secondary | ICD-10-CM | POA: Diagnosis not present

## 2015-11-02 DIAGNOSIS — M9901 Segmental and somatic dysfunction of cervical region: Secondary | ICD-10-CM | POA: Diagnosis not present

## 2015-11-02 DIAGNOSIS — M9902 Segmental and somatic dysfunction of thoracic region: Secondary | ICD-10-CM | POA: Diagnosis not present

## 2015-11-02 DIAGNOSIS — M50322 Other cervical disc degeneration at C5-C6 level: Secondary | ICD-10-CM | POA: Diagnosis not present

## 2015-11-02 DIAGNOSIS — M5031 Other cervical disc degeneration,  high cervical region: Secondary | ICD-10-CM | POA: Diagnosis not present

## 2015-11-02 DIAGNOSIS — M5412 Radiculopathy, cervical region: Secondary | ICD-10-CM | POA: Diagnosis not present

## 2015-11-05 DIAGNOSIS — M50321 Other cervical disc degeneration at C4-C5 level: Secondary | ICD-10-CM | POA: Diagnosis not present

## 2015-11-05 DIAGNOSIS — M9901 Segmental and somatic dysfunction of cervical region: Secondary | ICD-10-CM | POA: Diagnosis not present

## 2015-11-05 DIAGNOSIS — M5412 Radiculopathy, cervical region: Secondary | ICD-10-CM | POA: Diagnosis not present

## 2015-11-05 DIAGNOSIS — M9902 Segmental and somatic dysfunction of thoracic region: Secondary | ICD-10-CM | POA: Diagnosis not present

## 2015-11-05 DIAGNOSIS — M5031 Other cervical disc degeneration,  high cervical region: Secondary | ICD-10-CM | POA: Diagnosis not present

## 2015-11-05 DIAGNOSIS — M50322 Other cervical disc degeneration at C5-C6 level: Secondary | ICD-10-CM | POA: Diagnosis not present

## 2015-11-05 DIAGNOSIS — M50323 Other cervical disc degeneration at C6-C7 level: Secondary | ICD-10-CM | POA: Diagnosis not present

## 2015-11-11 DIAGNOSIS — M5031 Other cervical disc degeneration,  high cervical region: Secondary | ICD-10-CM | POA: Diagnosis not present

## 2015-11-11 DIAGNOSIS — M50321 Other cervical disc degeneration at C4-C5 level: Secondary | ICD-10-CM | POA: Diagnosis not present

## 2015-11-11 DIAGNOSIS — M50322 Other cervical disc degeneration at C5-C6 level: Secondary | ICD-10-CM | POA: Diagnosis not present

## 2015-11-11 DIAGNOSIS — M5412 Radiculopathy, cervical region: Secondary | ICD-10-CM | POA: Diagnosis not present

## 2015-11-11 DIAGNOSIS — M50323 Other cervical disc degeneration at C6-C7 level: Secondary | ICD-10-CM | POA: Diagnosis not present

## 2015-11-11 DIAGNOSIS — M9902 Segmental and somatic dysfunction of thoracic region: Secondary | ICD-10-CM | POA: Diagnosis not present

## 2015-11-11 DIAGNOSIS — M9901 Segmental and somatic dysfunction of cervical region: Secondary | ICD-10-CM | POA: Diagnosis not present

## 2015-11-12 DIAGNOSIS — M50321 Other cervical disc degeneration at C4-C5 level: Secondary | ICD-10-CM | POA: Diagnosis not present

## 2015-11-12 DIAGNOSIS — M9902 Segmental and somatic dysfunction of thoracic region: Secondary | ICD-10-CM | POA: Diagnosis not present

## 2015-11-12 DIAGNOSIS — M5031 Other cervical disc degeneration,  high cervical region: Secondary | ICD-10-CM | POA: Diagnosis not present

## 2015-11-12 DIAGNOSIS — M50322 Other cervical disc degeneration at C5-C6 level: Secondary | ICD-10-CM | POA: Diagnosis not present

## 2015-11-12 DIAGNOSIS — M50323 Other cervical disc degeneration at C6-C7 level: Secondary | ICD-10-CM | POA: Diagnosis not present

## 2015-11-12 DIAGNOSIS — M5412 Radiculopathy, cervical region: Secondary | ICD-10-CM | POA: Diagnosis not present

## 2015-11-12 DIAGNOSIS — M9901 Segmental and somatic dysfunction of cervical region: Secondary | ICD-10-CM | POA: Diagnosis not present

## 2015-11-16 DIAGNOSIS — M9902 Segmental and somatic dysfunction of thoracic region: Secondary | ICD-10-CM | POA: Diagnosis not present

## 2015-11-16 DIAGNOSIS — M5031 Other cervical disc degeneration,  high cervical region: Secondary | ICD-10-CM | POA: Diagnosis not present

## 2015-11-16 DIAGNOSIS — M50322 Other cervical disc degeneration at C5-C6 level: Secondary | ICD-10-CM | POA: Diagnosis not present

## 2015-11-16 DIAGNOSIS — M9901 Segmental and somatic dysfunction of cervical region: Secondary | ICD-10-CM | POA: Diagnosis not present

## 2015-11-16 DIAGNOSIS — M50321 Other cervical disc degeneration at C4-C5 level: Secondary | ICD-10-CM | POA: Diagnosis not present

## 2015-11-16 DIAGNOSIS — M50323 Other cervical disc degeneration at C6-C7 level: Secondary | ICD-10-CM | POA: Diagnosis not present

## 2015-11-16 DIAGNOSIS — M5412 Radiculopathy, cervical region: Secondary | ICD-10-CM | POA: Diagnosis not present

## 2015-11-30 DIAGNOSIS — M7741 Metatarsalgia, right foot: Secondary | ICD-10-CM | POA: Diagnosis not present

## 2015-12-01 DIAGNOSIS — J029 Acute pharyngitis, unspecified: Secondary | ICD-10-CM | POA: Diagnosis not present

## 2016-02-08 DIAGNOSIS — M47812 Spondylosis without myelopathy or radiculopathy, cervical region: Secondary | ICD-10-CM | POA: Diagnosis not present

## 2016-02-22 DIAGNOSIS — M47812 Spondylosis without myelopathy or radiculopathy, cervical region: Secondary | ICD-10-CM | POA: Diagnosis not present

## 2016-02-24 DIAGNOSIS — M47892 Other spondylosis, cervical region: Secondary | ICD-10-CM | POA: Diagnosis not present

## 2016-02-24 DIAGNOSIS — M542 Cervicalgia: Secondary | ICD-10-CM | POA: Diagnosis not present

## 2016-02-26 DIAGNOSIS — M542 Cervicalgia: Secondary | ICD-10-CM | POA: Diagnosis not present

## 2016-02-26 DIAGNOSIS — M47892 Other spondylosis, cervical region: Secondary | ICD-10-CM | POA: Diagnosis not present

## 2016-03-01 DIAGNOSIS — M542 Cervicalgia: Secondary | ICD-10-CM | POA: Diagnosis not present

## 2016-03-01 DIAGNOSIS — M47892 Other spondylosis, cervical region: Secondary | ICD-10-CM | POA: Diagnosis not present

## 2016-03-03 DIAGNOSIS — M47892 Other spondylosis, cervical region: Secondary | ICD-10-CM | POA: Diagnosis not present

## 2016-03-03 DIAGNOSIS — M542 Cervicalgia: Secondary | ICD-10-CM | POA: Diagnosis not present

## 2016-03-07 DIAGNOSIS — M542 Cervicalgia: Secondary | ICD-10-CM | POA: Diagnosis not present

## 2016-03-07 DIAGNOSIS — M47892 Other spondylosis, cervical region: Secondary | ICD-10-CM | POA: Diagnosis not present

## 2016-03-08 DIAGNOSIS — M542 Cervicalgia: Secondary | ICD-10-CM | POA: Diagnosis not present

## 2016-03-08 DIAGNOSIS — M47892 Other spondylosis, cervical region: Secondary | ICD-10-CM | POA: Diagnosis not present

## 2016-03-15 DIAGNOSIS — M542 Cervicalgia: Secondary | ICD-10-CM | POA: Diagnosis not present

## 2016-03-15 DIAGNOSIS — M47892 Other spondylosis, cervical region: Secondary | ICD-10-CM | POA: Diagnosis not present

## 2016-03-17 DIAGNOSIS — M47892 Other spondylosis, cervical region: Secondary | ICD-10-CM | POA: Diagnosis not present

## 2016-03-17 DIAGNOSIS — M542 Cervicalgia: Secondary | ICD-10-CM | POA: Diagnosis not present

## 2016-03-21 DIAGNOSIS — M542 Cervicalgia: Secondary | ICD-10-CM | POA: Diagnosis not present

## 2016-03-21 DIAGNOSIS — M47892 Other spondylosis, cervical region: Secondary | ICD-10-CM | POA: Diagnosis not present

## 2016-03-24 DIAGNOSIS — M542 Cervicalgia: Secondary | ICD-10-CM | POA: Diagnosis not present

## 2016-03-24 DIAGNOSIS — M47892 Other spondylosis, cervical region: Secondary | ICD-10-CM | POA: Diagnosis not present

## 2016-03-29 DIAGNOSIS — M47892 Other spondylosis, cervical region: Secondary | ICD-10-CM | POA: Diagnosis not present

## 2016-03-29 DIAGNOSIS — M542 Cervicalgia: Secondary | ICD-10-CM | POA: Diagnosis not present

## 2016-03-31 DIAGNOSIS — M47892 Other spondylosis, cervical region: Secondary | ICD-10-CM | POA: Diagnosis not present

## 2016-03-31 DIAGNOSIS — M542 Cervicalgia: Secondary | ICD-10-CM | POA: Diagnosis not present

## 2016-04-02 DIAGNOSIS — I10 Essential (primary) hypertension: Secondary | ICD-10-CM | POA: Diagnosis not present

## 2016-04-02 DIAGNOSIS — E559 Vitamin D deficiency, unspecified: Secondary | ICD-10-CM | POA: Diagnosis not present

## 2016-04-02 DIAGNOSIS — E785 Hyperlipidemia, unspecified: Secondary | ICD-10-CM | POA: Diagnosis not present

## 2016-04-04 DIAGNOSIS — M542 Cervicalgia: Secondary | ICD-10-CM | POA: Diagnosis not present

## 2016-04-04 DIAGNOSIS — M47892 Other spondylosis, cervical region: Secondary | ICD-10-CM | POA: Diagnosis not present

## 2016-04-07 DIAGNOSIS — I10 Essential (primary) hypertension: Secondary | ICD-10-CM | POA: Diagnosis not present

## 2016-04-07 DIAGNOSIS — W57XXXA Bitten or stung by nonvenomous insect and other nonvenomous arthropods, initial encounter: Secondary | ICD-10-CM | POA: Diagnosis not present

## 2016-04-07 DIAGNOSIS — E785 Hyperlipidemia, unspecified: Secondary | ICD-10-CM | POA: Diagnosis not present

## 2016-04-07 DIAGNOSIS — S50861A Insect bite (nonvenomous) of right forearm, initial encounter: Secondary | ICD-10-CM | POA: Diagnosis not present

## 2016-04-07 DIAGNOSIS — E559 Vitamin D deficiency, unspecified: Secondary | ICD-10-CM | POA: Diagnosis not present

## 2016-04-12 DIAGNOSIS — M542 Cervicalgia: Secondary | ICD-10-CM | POA: Diagnosis not present

## 2016-04-12 DIAGNOSIS — M47892 Other spondylosis, cervical region: Secondary | ICD-10-CM | POA: Diagnosis not present

## 2016-04-19 DIAGNOSIS — E7800 Pure hypercholesterolemia, unspecified: Secondary | ICD-10-CM | POA: Diagnosis not present

## 2016-04-19 DIAGNOSIS — J301 Allergic rhinitis due to pollen: Secondary | ICD-10-CM | POA: Diagnosis not present

## 2016-04-19 DIAGNOSIS — M79605 Pain in left leg: Secondary | ICD-10-CM | POA: Diagnosis not present

## 2016-04-19 DIAGNOSIS — I10 Essential (primary) hypertension: Secondary | ICD-10-CM | POA: Diagnosis not present

## 2016-04-20 DIAGNOSIS — M542 Cervicalgia: Secondary | ICD-10-CM | POA: Diagnosis not present

## 2016-04-20 DIAGNOSIS — M47892 Other spondylosis, cervical region: Secondary | ICD-10-CM | POA: Diagnosis not present

## 2016-06-02 DIAGNOSIS — D225 Melanocytic nevi of trunk: Secondary | ICD-10-CM | POA: Diagnosis not present

## 2016-06-02 DIAGNOSIS — L738 Other specified follicular disorders: Secondary | ICD-10-CM | POA: Diagnosis not present

## 2016-06-02 DIAGNOSIS — L821 Other seborrheic keratosis: Secondary | ICD-10-CM | POA: Diagnosis not present

## 2016-06-02 DIAGNOSIS — D1800 Hemangioma unspecified site: Secondary | ICD-10-CM | POA: Diagnosis not present

## 2016-06-02 DIAGNOSIS — Z86018 Personal history of other benign neoplasm: Secondary | ICD-10-CM | POA: Diagnosis not present

## 2016-06-02 DIAGNOSIS — D485 Neoplasm of uncertain behavior of skin: Secondary | ICD-10-CM | POA: Diagnosis not present

## 2016-06-02 DIAGNOSIS — L814 Other melanin hyperpigmentation: Secondary | ICD-10-CM | POA: Diagnosis not present

## 2016-07-21 DIAGNOSIS — Z1231 Encounter for screening mammogram for malignant neoplasm of breast: Secondary | ICD-10-CM | POA: Diagnosis not present

## 2016-09-11 DIAGNOSIS — R05 Cough: Secondary | ICD-10-CM | POA: Diagnosis not present

## 2016-10-21 DIAGNOSIS — E559 Vitamin D deficiency, unspecified: Secondary | ICD-10-CM | POA: Diagnosis not present

## 2016-10-21 DIAGNOSIS — Z1389 Encounter for screening for other disorder: Secondary | ICD-10-CM | POA: Diagnosis not present

## 2016-10-21 DIAGNOSIS — Z13820 Encounter for screening for osteoporosis: Secondary | ICD-10-CM | POA: Diagnosis not present

## 2016-10-21 DIAGNOSIS — E7800 Pure hypercholesterolemia, unspecified: Secondary | ICD-10-CM | POA: Diagnosis not present

## 2016-10-21 DIAGNOSIS — R109 Unspecified abdominal pain: Secondary | ICD-10-CM | POA: Diagnosis not present

## 2016-10-21 DIAGNOSIS — Z23 Encounter for immunization: Secondary | ICD-10-CM | POA: Diagnosis not present

## 2016-10-21 DIAGNOSIS — I10 Essential (primary) hypertension: Secondary | ICD-10-CM | POA: Diagnosis not present

## 2016-10-21 DIAGNOSIS — Z0000 Encounter for general adult medical examination without abnormal findings: Secondary | ICD-10-CM | POA: Diagnosis not present

## 2016-10-21 DIAGNOSIS — J3489 Other specified disorders of nose and nasal sinuses: Secondary | ICD-10-CM | POA: Diagnosis not present

## 2016-11-07 DIAGNOSIS — J101 Influenza due to other identified influenza virus with other respiratory manifestations: Secondary | ICD-10-CM | POA: Diagnosis not present

## 2016-11-23 DIAGNOSIS — Z780 Asymptomatic menopausal state: Secondary | ICD-10-CM | POA: Diagnosis not present

## 2016-11-23 DIAGNOSIS — Z13820 Encounter for screening for osteoporosis: Secondary | ICD-10-CM | POA: Diagnosis not present

## 2016-11-23 DIAGNOSIS — M8588 Other specified disorders of bone density and structure, other site: Secondary | ICD-10-CM | POA: Diagnosis not present

## 2017-04-27 ENCOUNTER — Other Ambulatory Visit: Payer: Self-pay | Admitting: Internal Medicine

## 2017-04-27 ENCOUNTER — Ambulatory Visit
Admission: RE | Admit: 2017-04-27 | Discharge: 2017-04-27 | Disposition: A | Discharge status: Home / Self Care | Payer: PPO | Source: Ambulatory Visit | Attending: Internal Medicine | Admitting: Internal Medicine

## 2017-04-27 DIAGNOSIS — E7800 Pure hypercholesterolemia, unspecified: Secondary | ICD-10-CM | POA: Diagnosis not present

## 2017-04-27 DIAGNOSIS — I10 Essential (primary) hypertension: Secondary | ICD-10-CM | POA: Diagnosis not present

## 2017-04-27 DIAGNOSIS — M79671 Pain in right foot: Secondary | ICD-10-CM

## 2017-06-01 DIAGNOSIS — L814 Other melanin hyperpigmentation: Secondary | ICD-10-CM | POA: Diagnosis not present

## 2017-06-01 DIAGNOSIS — L821 Other seborrheic keratosis: Secondary | ICD-10-CM | POA: Diagnosis not present

## 2017-06-01 DIAGNOSIS — H02826 Cysts of left eye, unspecified eyelid: Secondary | ICD-10-CM | POA: Diagnosis not present

## 2017-06-01 DIAGNOSIS — D225 Melanocytic nevi of trunk: Secondary | ICD-10-CM | POA: Diagnosis not present

## 2017-06-01 DIAGNOSIS — Z86018 Personal history of other benign neoplasm: Secondary | ICD-10-CM | POA: Diagnosis not present

## 2017-06-01 DIAGNOSIS — D1800 Hemangioma unspecified site: Secondary | ICD-10-CM | POA: Diagnosis not present

## 2017-06-26 DIAGNOSIS — M2041 Other hammer toe(s) (acquired), right foot: Secondary | ICD-10-CM | POA: Diagnosis not present

## 2017-06-26 DIAGNOSIS — G8929 Other chronic pain: Secondary | ICD-10-CM | POA: Diagnosis not present

## 2017-06-26 DIAGNOSIS — M79671 Pain in right foot: Secondary | ICD-10-CM | POA: Diagnosis not present

## 2017-07-03 DIAGNOSIS — H2513 Age-related nuclear cataract, bilateral: Secondary | ICD-10-CM | POA: Diagnosis not present

## 2017-07-31 DIAGNOSIS — Z6824 Body mass index (BMI) 24.0-24.9, adult: Secondary | ICD-10-CM | POA: Diagnosis not present

## 2017-07-31 DIAGNOSIS — Z01419 Encounter for gynecological examination (general) (routine) without abnormal findings: Secondary | ICD-10-CM | POA: Diagnosis not present

## 2017-07-31 DIAGNOSIS — Z1231 Encounter for screening mammogram for malignant neoplasm of breast: Secondary | ICD-10-CM | POA: Diagnosis not present

## 2017-08-21 DIAGNOSIS — R198 Other specified symptoms and signs involving the digestive system and abdomen: Secondary | ICD-10-CM | POA: Diagnosis not present

## 2017-08-21 DIAGNOSIS — Z1211 Encounter for screening for malignant neoplasm of colon: Secondary | ICD-10-CM | POA: Diagnosis not present

## 2017-08-21 DIAGNOSIS — R109 Unspecified abdominal pain: Secondary | ICD-10-CM | POA: Diagnosis not present

## 2017-08-22 DIAGNOSIS — R198 Other specified symptoms and signs involving the digestive system and abdomen: Secondary | ICD-10-CM | POA: Diagnosis not present

## 2017-08-22 DIAGNOSIS — J018 Other acute sinusitis: Secondary | ICD-10-CM | POA: Diagnosis not present

## 2017-08-22 DIAGNOSIS — J309 Allergic rhinitis, unspecified: Secondary | ICD-10-CM | POA: Diagnosis not present

## 2017-08-23 DIAGNOSIS — R198 Other specified symptoms and signs involving the digestive system and abdomen: Secondary | ICD-10-CM | POA: Diagnosis not present

## 2017-09-08 DIAGNOSIS — R198 Other specified symptoms and signs involving the digestive system and abdomen: Secondary | ICD-10-CM | POA: Diagnosis not present

## 2017-09-08 DIAGNOSIS — R109 Unspecified abdominal pain: Secondary | ICD-10-CM | POA: Diagnosis not present

## 2017-10-05 DIAGNOSIS — R198 Other specified symptoms and signs involving the digestive system and abdomen: Secondary | ICD-10-CM | POA: Diagnosis not present

## 2017-10-26 DIAGNOSIS — I10 Essential (primary) hypertension: Secondary | ICD-10-CM | POA: Diagnosis not present

## 2017-10-26 DIAGNOSIS — Z1389 Encounter for screening for other disorder: Secondary | ICD-10-CM | POA: Diagnosis not present

## 2017-10-26 DIAGNOSIS — R198 Other specified symptoms and signs involving the digestive system and abdomen: Secondary | ICD-10-CM | POA: Diagnosis not present

## 2017-10-26 DIAGNOSIS — Z0000 Encounter for general adult medical examination without abnormal findings: Secondary | ICD-10-CM | POA: Diagnosis not present

## 2017-10-26 DIAGNOSIS — E7800 Pure hypercholesterolemia, unspecified: Secondary | ICD-10-CM | POA: Diagnosis not present

## 2017-10-26 DIAGNOSIS — Z7189 Other specified counseling: Secondary | ICD-10-CM | POA: Diagnosis not present

## 2017-11-02 DIAGNOSIS — H903 Sensorineural hearing loss, bilateral: Secondary | ICD-10-CM | POA: Diagnosis not present

## 2017-11-17 DIAGNOSIS — H903 Sensorineural hearing loss, bilateral: Secondary | ICD-10-CM | POA: Diagnosis not present

## 2018-03-26 DIAGNOSIS — H43811 Vitreous degeneration, right eye: Secondary | ICD-10-CM | POA: Diagnosis not present

## 2018-03-30 DIAGNOSIS — K5901 Slow transit constipation: Secondary | ICD-10-CM | POA: Diagnosis not present

## 2018-03-30 DIAGNOSIS — R198 Other specified symptoms and signs involving the digestive system and abdomen: Secondary | ICD-10-CM | POA: Diagnosis not present

## 2018-04-09 DIAGNOSIS — H43811 Vitreous degeneration, right eye: Secondary | ICD-10-CM | POA: Diagnosis not present

## 2018-05-02 DIAGNOSIS — B351 Tinea unguium: Secondary | ICD-10-CM | POA: Diagnosis not present

## 2018-05-02 DIAGNOSIS — E7800 Pure hypercholesterolemia, unspecified: Secondary | ICD-10-CM | POA: Diagnosis not present

## 2018-05-02 DIAGNOSIS — I10 Essential (primary) hypertension: Secondary | ICD-10-CM | POA: Diagnosis not present

## 2018-05-09 DIAGNOSIS — Z86010 Personal history of colonic polyps: Secondary | ICD-10-CM | POA: Diagnosis not present

## 2018-05-09 DIAGNOSIS — R198 Other specified symptoms and signs involving the digestive system and abdomen: Secondary | ICD-10-CM | POA: Diagnosis not present

## 2018-05-17 DIAGNOSIS — N390 Urinary tract infection, site not specified: Secondary | ICD-10-CM | POA: Diagnosis not present

## 2018-07-10 DIAGNOSIS — H1013 Acute atopic conjunctivitis, bilateral: Secondary | ICD-10-CM | POA: Diagnosis not present

## 2018-07-23 DIAGNOSIS — H1013 Acute atopic conjunctivitis, bilateral: Secondary | ICD-10-CM | POA: Diagnosis not present

## 2018-08-15 DIAGNOSIS — Z23 Encounter for immunization: Secondary | ICD-10-CM | POA: Diagnosis not present

## 2018-08-15 DIAGNOSIS — L603 Nail dystrophy: Secondary | ICD-10-CM | POA: Diagnosis not present

## 2018-08-15 DIAGNOSIS — L601 Onycholysis: Secondary | ICD-10-CM | POA: Diagnosis not present

## 2018-11-09 ENCOUNTER — Ambulatory Visit
Admission: RE | Admit: 2018-11-09 | Discharge: 2018-11-09 | Disposition: A | Discharge status: Home / Self Care | Payer: PPO | Source: Ambulatory Visit | Attending: Internal Medicine | Admitting: Internal Medicine

## 2018-11-09 ENCOUNTER — Other Ambulatory Visit: Payer: Self-pay | Admitting: Internal Medicine

## 2018-11-09 DIAGNOSIS — M25552 Pain in left hip: Secondary | ICD-10-CM

## 2018-11-09 DIAGNOSIS — Z0000 Encounter for general adult medical examination without abnormal findings: Secondary | ICD-10-CM | POA: Diagnosis not present

## 2018-11-09 DIAGNOSIS — R200 Anesthesia of skin: Secondary | ICD-10-CM | POA: Diagnosis not present

## 2018-11-09 DIAGNOSIS — I10 Essential (primary) hypertension: Secondary | ICD-10-CM | POA: Diagnosis not present

## 2018-11-09 DIAGNOSIS — E7800 Pure hypercholesterolemia, unspecified: Secondary | ICD-10-CM | POA: Diagnosis not present

## 2018-11-09 DIAGNOSIS — M1612 Unilateral primary osteoarthritis, left hip: Secondary | ICD-10-CM | POA: Diagnosis not present

## 2018-11-09 DIAGNOSIS — Z1389 Encounter for screening for other disorder: Secondary | ICD-10-CM | POA: Diagnosis not present

## 2018-11-09 DIAGNOSIS — Z1159 Encounter for screening for other viral diseases: Secondary | ICD-10-CM | POA: Diagnosis not present

## 2018-11-09 DIAGNOSIS — M79605 Pain in left leg: Secondary | ICD-10-CM | POA: Diagnosis not present

## 2018-11-25 DIAGNOSIS — J309 Allergic rhinitis, unspecified: Secondary | ICD-10-CM | POA: Diagnosis not present

## 2018-11-30 DIAGNOSIS — Z1231 Encounter for screening mammogram for malignant neoplasm of breast: Secondary | ICD-10-CM | POA: Diagnosis not present

## 2019-03-02 ENCOUNTER — Encounter (HOSPITAL_COMMUNITY): Payer: Self-pay | Admitting: Emergency Medicine

## 2019-03-02 ENCOUNTER — Emergency Department (HOSPITAL_COMMUNITY)
Admission: EM | Admit: 2019-03-02 | Discharge: 2019-03-02 | Disposition: A | Discharge status: Home / Self Care | Payer: PPO | Attending: Emergency Medicine | Admitting: Emergency Medicine

## 2019-03-02 ENCOUNTER — Other Ambulatory Visit: Payer: Self-pay

## 2019-03-02 DIAGNOSIS — T783XXA Angioneurotic edema, initial encounter: Secondary | ICD-10-CM | POA: Diagnosis not present

## 2019-03-02 DIAGNOSIS — Z79899 Other long term (current) drug therapy: Secondary | ICD-10-CM | POA: Insufficient documentation

## 2019-03-02 DIAGNOSIS — I10 Essential (primary) hypertension: Secondary | ICD-10-CM | POA: Insufficient documentation

## 2019-03-02 DIAGNOSIS — R600 Localized edema: Secondary | ICD-10-CM | POA: Diagnosis present

## 2019-03-02 MED ORDER — AMLODIPINE BESYLATE 5 MG PO TABS: 5.0000 mg | ORAL_TABLET | Freq: Every day | ORAL | 0 refills | Status: DC

## 2019-03-02 NOTE — Discharge Instructions (Addendum)
Stop taking the lisinopril.  Call your doctor on Monday to schedule a follow-up appointment.  Return here at once for any trouble swallowing, severe lip swelling, or any other problems.

## 2019-03-02 NOTE — ED Triage Notes (Signed)
Pt reports woke up this morning around 5am. Reports went to drink and noticed swelling in lips and left side of face. Took Benadryl, but feels swelling getting worse. Takes Lisinopril.

## 2019-03-02 NOTE — ED Provider Notes (Signed)
[place] DEPT Provider Note   CSN: [id number] Arrival date & time: 03/02/19  8144    History   Chief Complaint Chief Complaint  Patient presents with  . Facial Swelling    HPI [name] is a 68 y.o. female.     68 year old female presents with swelling to her left upper lip.  Patient does take lisinopril.  Denies any trouble swallowing.  Is not short of breath.  Denies any new medication use.  Took Benadryl without relief.  Denies any rashes or areas of pruritus.     Past Medical History:  Diagnosis Date  . Adenomatous colon polyp   . Allergy    seasonal  . Constipation   . Diverticulosis   . Headache(784.0)   . Hyperlipidemia   . Hypertension   . Proctitis 07/08/1998    Patient Active Problem List   Diagnosis Date Noted  . SHOULDER PAIN, RIGHT 09/16/2008  . [place] DISEASE, LUMBOSACRAL SPINE 08/20/2008  . SCIATICA, LEFT 08/20/2008  . LEG PAIN, CHRONIC, LEFT 08/20/2008    Past Surgical History:  Procedure Laterality Date  . ABDOMINAL HYSTERECTOMY  2009  . BLADDER SURGERY  2009   Tack     OB History   No obstetric history on file.      Home Medications    Prior to Admission medications   Medication Sig Start Date End Date Taking? Authorizing Provider  Calcium Carbonate-Vit D-Min (CALCIUM 1200 PO) Take 1 tablet by mouth daily.      [provider]  Docusate Calcium (STOOL SOFTENER PO) Take by mouth as needed.      [provider]  fluticasone (FLONASE) 50 MCG/ACT nasal spray Place 1 spray into the nose Twice daily. 08/12/12   [provider]  hyoscyamine (LEVSIN SL) 0.125 MG SL tablet Take one sublingual every 4-6 hours prn 06/22/12   Lafayette Dragon, MD  lisinopril (PRINIVIL,ZESTRIL) 20 MG tablet One by mouth once daily  08/13/11   [provider]  Probiotic Product (ALIGN) 4 MG CAPS Take 4 mg by mouth daily. 08/09/11   Lafayette Dragon, MD  simvastatin (ZOCOR) 40 MG  tablet One by mouth once daily  08/27/11   [provider]    Family History Family History  Problem Relation Age of Onset  . Cervical cancer Maternal Grandmother   . Irritable bowel syndrome Mother   . Irritable bowel syndrome Brother   . Colon cancer Neg Hx     Social History Social History   Tobacco Use  . Smoking status: Never Smoker  . Smokeless tobacco: Never Used  Substance Use Topics  . Alcohol use: Yes    Alcohol/week: 1.0 standard drinks    Types: 1 drink(s) per week    Comment: 1 drink a week   . Drug use: No     Allergies   Septra [bactrim]   Review of Systems Review of Systems  All other systems reviewed and are negative.    Physical Exam Updated Vital Signs BP (!) 159/80 (BP Location: Right Arm)   Pulse 66   Temp 98.4 F (36.9 C) (Oral)   Resp 17   SpO2 99%   Physical Exam Vitals signs and nursing note reviewed.  Constitutional:      General: She is not in acute distress.    Appearance: Normal appearance. She is well-developed. She is not toxic-appearing.  HENT:     Head: Normocephalic and atraumatic.     Mouth/Throat:  Mouth: Angioedema present.     Comments: Mild lateral upper lip swelling tounge normal No stridor Eyes:     General: Lids are normal.     Conjunctiva/sclera: Conjunctivae normal.     Pupils: Pupils are equal, round, and reactive to light.  Neck:     Musculoskeletal: Normal range of motion and neck supple.     Thyroid: No thyroid mass.     Trachea: No tracheal deviation.  Cardiovascular:     Rate and Rhythm: Normal rate and regular rhythm.     Heart sounds: Normal heart sounds. No murmur. No gallop.   Pulmonary:     Effort: Pulmonary effort is normal. No respiratory distress.     Breath sounds: Normal breath sounds. No stridor. No decreased breath sounds, wheezing, rhonchi or rales.  Abdominal:     General: Bowel sounds are normal. There is no distension.     Palpations: Abdomen is soft.      Tenderness: There is no abdominal tenderness. There is no rebound.  Musculoskeletal: Normal range of motion.        General: No tenderness.  Skin:    General: Skin is warm and dry.     Findings: No abrasion or rash.  Neurological:     Mental Status: She is alert and oriented to person, place, and time.     GCS: GCS eye subscore is 4. GCS verbal subscore is 5. GCS motor subscore is 6.     Cranial Nerves: No cranial nerve deficit.     Sensory: No sensory deficit.  Psychiatric:        Speech: Speech normal.        Behavior: Behavior normal.      ED Treatments / Results  Labs (all labs ordered are listed, but only abnormal results are displayed) Labs Reviewed - No data to display  EKG None  Radiology No results found.  Procedures Procedures (including critical care time)  Medications Ordered in ED Medications - No data to display   Initial Impression / Assessment and Plan / ED Course  I have reviewed the triage vital signs and the nursing notes.  Pertinent labs & imaging results that were available during my care of the patient were reviewed by me and considered in my medical decision making (see chart for details).        Patient with likely angioedema which is very mild.  We will stop her lisinopril and place her on Norvasc for 3 days until she can see her primary care doctor and be placed on a different antihypertensive.  Final Clinical Impressions(s) / ED Diagnoses   Final diagnoses:  None    ED Discharge Orders    None       Lacretia Leigh, MD 03/02/19 1001

## 2019-03-05 DIAGNOSIS — T783XXA Angioneurotic edema, initial encounter: Secondary | ICD-10-CM | POA: Diagnosis not present

## 2019-03-05 DIAGNOSIS — E7800 Pure hypercholesterolemia, unspecified: Secondary | ICD-10-CM | POA: Diagnosis not present

## 2019-03-05 DIAGNOSIS — I10 Essential (primary) hypertension: Secondary | ICD-10-CM | POA: Diagnosis not present

## 2019-03-06 ENCOUNTER — Other Ambulatory Visit: Payer: Self-pay | Admitting: *Deleted

## 2019-03-06 NOTE — Patient Outreach (Signed)
[facility] [facility]) Care Management  03/06/2019  [name] Sep 13, 1951 [id number]   Subjective: Telephone call to patient's home  / mobile number, no answer, left HIPAA compliant voicemail message, and requested call back.    Objective: Per KPN (Knowledge Performance Now, point of care tool) and chart review, patient had ED visit on 03/02/2019 for facial swelling.  Patient has had no recent hospitalizations.   Patient also has a history of hypertension, hyperlipidemia, and diverticulosis.    Assessment: Received [employer] Nurse Call Line follow up referral on 03/04/2019. Referral reason: Patient called nurse line stating bottom lip, left side of face swelling, and nurse call line advised patient to ED.   Nurse Call Line follow up pending patient contact.       Plan: RNCM will call patient for 2nd telephone outreach attempt within 4 business days, nurse line screening follow up, and will proceed with case closure within 10 business days if no return call.     [name] H. Annia Friendly, BSN, [place] Management [facility] Telephonic CM Phone: [phone] Fax: [phone]

## 2019-03-07 ENCOUNTER — Other Ambulatory Visit: Payer: Self-pay | Admitting: *Deleted

## 2019-03-07 NOTE — Patient Outreach (Signed)
[facility]) Care Management  03/07/2019  CHARRISE LARDNER 26-Sep-1951 [id number]   Subjective: Received voicemail message from Tanja Port, states she is returning call, and requested call back. Telephone call to patient's home  / mobile number, no answer, left HIPAA compliant voicemail message, and requested call back.    Objective: Per KPN (Knowledge Performance Now, point of care tool) and chart review, patient had ED visit on 03/02/2019 for facial swelling.  Patient has had no recent hospitalizations.   Patient also has a history of hypertension, hyperlipidemia, and diverticulosis.    Assessment: Received [employer] Nurse Call Line follow up referral on 03/04/2019. Referral reason: Patient called nurse line stating bottom lip, left side of face swelling, and nurse call line advised patient to ED.   Nurse Call Line follow up pending patient contact.       Plan: RNCM will call patient for 3rd telephone outreach attempt within 4 business days, nurse line screening follow up, and will proceed with case closure within 10 business days if no return call.     Keontae Levingston H. Annia Friendly, BSN, [place] Management [facility] Telephonic CM Phone: [phone] Fax: [phone]

## 2019-03-08 ENCOUNTER — Other Ambulatory Visit: Payer: Self-pay | Admitting: *Deleted

## 2019-03-08 NOTE — Patient Outreach (Signed)
[facility]) Care Management  03/08/2019  [name] 1951/01/07 [id number]   Subjective:  Telephone call to patient's home / mobile number, spoke with patient, and HIPAA verified.  Discussed [facility] Care Management [employer] Nurse Call Line follow up, patient voiced understanding, and is in agreement to follow up.  Patient states she is doing much better, called into the nurse regarding allergic reaction, was seen in the ED on 03/02/2019, was told she had an allergic reaction to her blood pressure medication, blood pressure medication was discontinued, and was advised by the ED MD to follow up with primary MD regarding starting new blood pressure medication.   States she did a virtual follow up visit with primary MD on 03/05/2019, visit went well, started on new blood pressure medication,  noticed some swelling in ankle last night, and is planning to call primary MD today to report. Patient states she is aware of signs/ symptoms to report, how to reach provider if needed after hours, when to go to ED, and / or call 911.   Patient states she does not have any education material, nurse call line, care coordination, disease management, disease monitoring, transportation, community resource, or pharmacy needs at this time. States she is very appreciative of the follow up.   Objective:Per KPN (Knowledge Performance Now, point of care tool) and chart review,patient had ED visit on 03/02/2019 for facial swelling. Patient has had no recent hospitalizations. Patient also has a history of hypertension, hyperlipidemia, and diverticulosis.    Assessment: Received [employer] Nurse Call Line follow up referral on 03/04/2019. Referral reason: Patient called nurse line stating bottom lip, left side of face swelling, and nurse call line advised patient to ED.Nurse line follow up completed and no further care management needs.       Plan:RNCM will complete case  closure due to follow up completed / no care management needs.      [name] H. Annia Friendly, BSN, [place] Management [facility] Telephonic CM Phone: [phone] Fax: [phone]

## 2019-04-27 DIAGNOSIS — S46119A Strain of muscle, fascia and tendon of long head of biceps, unspecified arm, initial encounter: Secondary | ICD-10-CM | POA: Diagnosis not present

## 2019-05-08 DIAGNOSIS — M7062 Trochanteric bursitis, left hip: Secondary | ICD-10-CM | POA: Diagnosis not present

## 2019-05-14 DIAGNOSIS — M66821 Spontaneous rupture of other tendons, right upper arm: Secondary | ICD-10-CM | POA: Diagnosis not present

## 2019-05-20 DIAGNOSIS — M66821 Spontaneous rupture of other tendons, right upper arm: Secondary | ICD-10-CM | POA: Diagnosis not present

## 2019-05-23 DIAGNOSIS — M66821 Spontaneous rupture of other tendons, right upper arm: Secondary | ICD-10-CM | POA: Diagnosis not present

## 2019-05-27 DIAGNOSIS — M66821 Spontaneous rupture of other tendons, right upper arm: Secondary | ICD-10-CM | POA: Diagnosis not present

## 2019-05-31 DIAGNOSIS — M66821 Spontaneous rupture of other tendons, right upper arm: Secondary | ICD-10-CM | POA: Diagnosis not present

## 2019-06-03 DIAGNOSIS — M66821 Spontaneous rupture of other tendons, right upper arm: Secondary | ICD-10-CM | POA: Diagnosis not present

## 2019-06-07 DIAGNOSIS — M66821 Spontaneous rupture of other tendons, right upper arm: Secondary | ICD-10-CM | POA: Diagnosis not present

## 2019-06-12 DIAGNOSIS — M66821 Spontaneous rupture of other tendons, right upper arm: Secondary | ICD-10-CM | POA: Diagnosis not present

## 2019-06-14 DIAGNOSIS — M66821 Spontaneous rupture of other tendons, right upper arm: Secondary | ICD-10-CM | POA: Diagnosis not present

## 2019-06-21 DIAGNOSIS — M66821 Spontaneous rupture of other tendons, right upper arm: Secondary | ICD-10-CM | POA: Diagnosis not present

## 2019-06-27 DIAGNOSIS — M66821 Spontaneous rupture of other tendons, right upper arm: Secondary | ICD-10-CM | POA: Diagnosis not present

## 2019-06-27 DIAGNOSIS — M79662 Pain in left lower leg: Secondary | ICD-10-CM | POA: Diagnosis not present

## 2019-06-27 DIAGNOSIS — M545 Low back pain: Secondary | ICD-10-CM | POA: Diagnosis not present

## 2019-07-16 ENCOUNTER — Other Ambulatory Visit: Payer: Self-pay

## 2019-07-16 ENCOUNTER — Ambulatory Visit (INDEPENDENT_AMBULATORY_CARE_PROVIDER_SITE_OTHER): Payer: PPO | Admitting: Neurology

## 2019-07-16 ENCOUNTER — Encounter: Payer: Self-pay | Admitting: Neurology

## 2019-07-16 ENCOUNTER — Ambulatory Visit: Payer: PPO | Admitting: Neurology

## 2019-07-16 DIAGNOSIS — G6289 Other specified polyneuropathies: Secondary | ICD-10-CM

## 2019-07-16 DIAGNOSIS — Z0289 Encounter for other administrative examinations: Secondary | ICD-10-CM

## 2019-07-16 HISTORY — DX: Other specified polyneuropathies: G62.89

## 2019-07-16 NOTE — Progress Notes (Signed)
Please refer to EMG and nerve conduction procedure note.  

## 2019-07-16 NOTE — Procedures (Signed)
     HISTORY:  Tarya Mccosh is a 68 year old patient with an 18-year history of discomfort involving the lateral aspect of the calf muscle on the left leg.  The problems bother her more at nighttime, not as much when she is up walking.  She does have intermittent back pain but this is not a significant component to her symptom complex.  She is being evaluated for possible neuropathy or a lumbosacral radiculopathy.   NERVE CONDUCTION STUDIES:  Nerve conduction studies were performed on both lower extremities.  The distal motor latencies for the peroneal nerves were prolonged on the right and normal on the left with normal distal motor latencies for the posterior tibial nerves bilaterally.  The motor amplitudes for the left peroneal nerve were normal but were low for the right peroneal nerve and for the posterior tibial nerves bilaterally.  Slowing was seen for the peroneal and posterior tibial nerves bilaterally.  The left sural sensory latency was prolonged but was normal for the right sural nerve and for the peroneal nerves bilaterally.  The F-wave latencies for the posterior tibial nerves were prolonged bilaterally.  EMG STUDIES:  EMG study was performed on the left lower extremity:  The tibialis anterior muscle reveals 2 to 5K motor units with slightly reduced recruitment. No fibrillations or positive waves were seen. The peroneus tertius muscle reveals 2 to 4K motor units with slightly reduced recruitment. No fibrillations or positive waves were seen. The medial gastrocnemius muscle reveals 1 to 3K motor units with full recruitment. No fibrillations or positive waves were seen. The vastus lateralis muscle reveals 2 to 4K motor units with full recruitment. No fibrillations or positive waves were seen. The iliopsoas muscle reveals 2 to 4K motor units with full recruitment. No fibrillations or positive waves were seen. The biceps femoris muscle (long head) reveals 2 to 4K motor units with full  recruitment. No fibrillations or positive waves were seen. The lumbosacral paraspinal muscles were tested at 3 levels, and revealed no abnormalities of insertional activity at all 3 levels tested. There was good relaxation.   IMPRESSION:  Nerve conduction studies done on both lower extremities show evidence of what appears to be a primarily motor neuropathy affecting both lower extremities.  EMG evaluation of the left lower extremity shows very mild distal chronic signs of neuropathic denervation, no clear evidence of an overlying lumbosacral radiculopathy was seen.  Jill Alexanders MD 07/16/2019 11:24 AM  [facility] [address] [place], [place] [address]  Phone [phone] Fax [phone]

## 2019-07-16 NOTE — Progress Notes (Signed)
[place]    Nerve / Sites Muscle Latency Ref. Amplitude Ref. Rel Amp Segments Distance Velocity Ref. Area    ms ms mV mV %  cm m/s m/s mVms  L Peroneal - EDB     Ankle EDB 5.4 ?6.5 2.1 ?2.0 100 Ankle - EDB 9   7.3     Fib head EDB 13.5  1.5  70.6 Fib head - Ankle 29 36 ?44 7.5     Pop fossa EDB 16.4  1.2  76.6 Pop fossa - Fib head 10 35 ?44 6.0         Pop fossa - Ankle      R Peroneal - EDB     Ankle EDB 7.0 ?6.5 0.3 ?2.0 100 Ankle - EDB 9   1.8     Fib head EDB 16.4  0.3  103 Fib head - Ankle 28 30 ?44 0.7     Pop fossa EDB 19.7  0.3  90.4 Pop fossa - Fib head 10 30 ?44 0.7         Pop fossa - Ankle      L Tibial - AH     Ankle AH 5.1 ?5.8 0.6 ?4.0 100 Ankle - AH 9   2.6     Pop fossa AH 17.1  0.2  30.8 Pop fossa - Ankle 39 32 ?41 0.9  R Tibial - AH     Ankle AH 5.1 ?5.8 0.8 ?4.0 100 Ankle - AH 9   3.1     Pop fossa AH 18.2  0.3  38.9 Pop fossa - Ankle 39 30 ?41 1.8             SNC    Nerve / Sites Rec. Site Peak Lat Ref.  Amp Ref. Segments Distance    ms ms V V  cm  L Sural - Ankle (Calf)     Calf Ankle 4.8 ?4.4 4 ?6 Calf - Ankle 14  R Sural - Ankle (Calf)     Calf Ankle 3.8 ?4.4 3 ?6 Calf - Ankle 14  L Superficial peroneal - Ankle     Lat leg Ankle 4.1 ?4.4 6 ?6 Lat leg - Ankle 14  R Superficial peroneal - Ankle     Lat leg Ankle 4.0 ?4.4 7 ?6 Lat leg - Ankle 14              F  Wave    Nerve F Lat Ref.   ms ms  L Tibial - AH 70.5 ?56.0  R Tibial - AH 71.7 ?56.0

## 2019-07-29 DIAGNOSIS — M79662 Pain in left lower leg: Secondary | ICD-10-CM | POA: Diagnosis not present

## 2019-07-29 DIAGNOSIS — M79661 Pain in right lower leg: Secondary | ICD-10-CM | POA: Diagnosis not present

## 2019-07-30 DIAGNOSIS — L650 Telogen effluvium: Secondary | ICD-10-CM | POA: Diagnosis not present

## 2019-07-30 DIAGNOSIS — L821 Other seborrheic keratosis: Secondary | ICD-10-CM | POA: Diagnosis not present

## 2019-07-30 DIAGNOSIS — Z86018 Personal history of other benign neoplasm: Secondary | ICD-10-CM | POA: Diagnosis not present

## 2019-07-30 DIAGNOSIS — B351 Tinea unguium: Secondary | ICD-10-CM | POA: Diagnosis not present

## 2019-07-30 DIAGNOSIS — Z23 Encounter for immunization: Secondary | ICD-10-CM | POA: Diagnosis not present

## 2019-07-30 DIAGNOSIS — L814 Other melanin hyperpigmentation: Secondary | ICD-10-CM | POA: Diagnosis not present

## 2019-07-30 DIAGNOSIS — D225 Melanocytic nevi of trunk: Secondary | ICD-10-CM | POA: Diagnosis not present

## 2019-10-22 ENCOUNTER — Other Ambulatory Visit: Payer: Self-pay

## 2019-10-22 ENCOUNTER — Ambulatory Visit: Payer: PPO | Admitting: Neurology

## 2019-10-22 ENCOUNTER — Encounter: Payer: Self-pay | Admitting: Neurology

## 2019-10-22 VITALS — BP 120/80 | HR 68 | Temp 97.0°F | Ht 67.5 in | Wt 157.0 lb

## 2019-10-22 DIAGNOSIS — G6289 Other specified polyneuropathies: Secondary | ICD-10-CM | POA: Diagnosis not present

## 2019-10-22 DIAGNOSIS — M79662 Pain in left lower leg: Secondary | ICD-10-CM

## 2019-10-22 DIAGNOSIS — E538 Deficiency of other specified B group vitamins: Secondary | ICD-10-CM | POA: Diagnosis not present

## 2019-10-22 MED ORDER — GABAPENTIN 100 MG PO CAPS: 100.0000 mg | ORAL_CAPSULE | Freq: Three times a day (TID) | ORAL | 3 refills | Status: DC

## 2019-10-22 NOTE — Patient Instructions (Signed)
We will start low dose gabapentin for the leg pain, call for any dose adjustments.  Neurontin (gabapentin) may result in drowsiness, ankle swelling, gait instability, or possibly dizziness. Please contact our office if significant side effects occur with this medication.

## 2019-10-22 NOTE — Progress Notes (Signed)
Reason for visit: Left leg pain, neuropathy  Referring physician: Dr. Madelaine Etienne is a 69 y.o. female  History of present illness:  [name] is a 69 year old right-handed white female with a history of some back pain and left leg pain that began around 2006 when she was doing yard work.  The patient was seen by Dr. Ellene Route around that time, MRI of the low back was done showing evidence of possible impingement of the right L5 nerve root, not the left.  Surgery was not contemplated.  The patient has had persistent pain discomfort in the lateral aspect of the left leg below the knee going to one half way down the leg but not to the ankle or foot.  The patient reports no true numbness, the pain is present at all times, upper back pain has dissipated.  She puts ice packs on the leg at night to help her rest.  She has never been on any medications.  She reports no weakness of the arms or legs, she denies pain in the right leg, she denies issues controlling the bowels or the bladder with exception that she has some irritable bowel syndrome symptoms.  She denies any neck pain or arm discomfort.  She reports no numbness in the feet.  Recent EMG nerve conduction study showed a primarily motor neuropathy.  For this reason, the patient is referred to this office for further evaluation.  The patient denies any increased discomfort in the leg with walking or bending or stooping.  She denies any balance issues.  She comes here for further evaluation.  Past Medical History:  Diagnosis Date  . Adenomatous colon polyp   . Allergy    seasonal  . Constipation   . Diverticulosis   . Headache(784.0)   . Hyperlipidemia   . Hypertension   . Peripheral motor neuropathy 07/16/2019  . Proctitis 07/08/1998    Past Surgical History:  Procedure Laterality Date  . ABDOMINAL HYSTERECTOMY  2009  . BLADDER SURGERY  2009   Tack    Family History  Problem Relation Age of Onset  . Cervical cancer  Maternal Grandmother   . Irritable bowel syndrome Mother   . Hypertension Mother   . Stroke Mother   . Irritable bowel syndrome Brother   . Aneurysm Father   . Colon cancer Neg Hx     Social history:  reports that she has never smoked. She has never used smokeless tobacco. She reports current alcohol use of about 1.0 standard drinks of alcohol per week. She reports that she does not use drugs.  Medications:  Prior to Admission medications   Medication Sig Start Date End Date Taking? Authorizing Provider  Calcium Carbonate-Vit D-Min (CALCIUM 1200 PO) Take 1 tablet by mouth daily.     Yes [provider]  fluticasone (FLONASE) 50 MCG/ACT nasal spray Place 1 spray into the nose Twice daily. 08/12/12  Yes [provider]      Allergies  Allergen Reactions  . Lisinopril Swelling  . Other Swelling    sulfa  . Septra [Bactrim] Swelling    ROS:  Out of a complete 14 system review of symptoms, the patient complains only of the following symptoms, and all other reviewed systems are negative.  Left leg pain  Blood pressure 120/80, pulse 68, temperature (!) 97 F (36.1 C), height 5' 7.5" (1.715 m), weight 157 lb (71.2 kg).  Physical Exam  General: The patient is alert and cooperative at  the time of the examination.  Eyes: Pupils are equal, round, and reactive to light. Discs are flat bilaterally.  Neck: The neck is supple, no carotid bruits are noted.  Respiratory: The respiratory examination is clear.  Cardiovascular: The cardiovascular examination reveals a regular rate and rhythm, no obvious murmurs or rubs are noted.  Skin: Extremities are without significant edema.  Neurologic Exam  Mental status: The patient is alert and oriented x 3 at the time of the examination. The patient has apparent normal recent and remote memory, with an apparently normal attention span and concentration ability.  Cranial nerves: Facial symmetry is present. There is good  sensation of the face to pinprick and soft touch bilaterally. The strength of the facial muscles and the muscles to head turning and shoulder shrug are normal bilaterally. Speech is well enunciated, no aphasia or dysarthria is noted. Extraocular movements are full. Visual fields are full. The tongue is midline, and the patient has symmetric elevation of the soft palate. No obvious hearing deficits are noted.  Motor: The motor testing reveals 5 over 5 strength of all 4 extremities. Good symmetric motor tone is noted throughout.  Sensory: Sensory testing is intact to pinprick, soft touch, vibration sensation, and position sense on all 4 extremities.  No definite evidence of a stocking pattern pinprick sensory deficit is seen on either leg.  No evidence of extinction is noted.  Coordination: Cerebellar testing reveals good finger-nose-finger and heel-to-shin bilaterally.  Gait and station: Gait is normal. Tandem gait is normal. Romberg is negative. No drift is seen.  The patient is able to walk on the heels and the toes bilaterally.  Reflexes: Deep tendon reflexes are symmetric and normal bilaterally, with exception of depression of ankle jerk reflexes bilaterally.. Toes are downgoing bilaterally.   Assessment/Plan:  1.  Primarily motor neuropathy  2.  Chronic left leg pain  The patient will be set up for blood work today.  We will give her a trial on gabapentin taking 100 mg 3 times daily, she will call for any dose adjustments.  In the future, we may consider a repeat MRI of the lumbar spine, sometimes motor type neuropathies may be associated with spinal stenosis, but she did not have this in 2006 when the left leg pain began.  She will follow-up here in 4 months.  Anna Alexanders MD 10/22/2019 8:33 AM  [facility] [address] [place], [place] [address]  Phone [phone] Fax [phone]

## 2019-10-25 LAB — MULTIPLE MYELOMA PANEL, SERUM
Albumin SerPl Elph-Mcnc: 4 g/dL (ref 2.9–4.4)
Albumin/Glob SerPl: 1.7 (ref 0.7–1.7)
Alpha 1: 0.2 g/dL (ref 0.0–0.4)
Alpha2 Glob SerPl Elph-Mcnc: 0.7 g/dL (ref 0.4–1.0)
B-Globulin SerPl Elph-Mcnc: 1 g/dL (ref 0.7–1.3)
Gamma Glob SerPl Elph-Mcnc: 0.6 g/dL (ref 0.4–1.8)
Globulin, Total: 2.5 g/dL (ref 2.2–3.9)
IgA/Immunoglobulin A, Serum: 120 mg/dL (ref 87–352)
IgG (Immunoglobin G), Serum: 608 mg/dL (ref 586–1602)
IgM (Immunoglobulin M), Srm: 34 mg/dL (ref 26–217)
Total Protein: 6.5 g/dL (ref 6.0–8.5)

## 2019-10-25 LAB — SEDIMENTATION RATE: Sed Rate: 3 mm/hr (ref 0–40)

## 2019-10-25 LAB — ANA W/REFLEX: Anti Nuclear Antibody (ANA): NEGATIVE

## 2019-10-25 LAB — VITAMIN B12: Vitamin B-12: 392 pg/mL (ref 232–1245)

## 2019-10-25 LAB — ANGIOTENSIN CONVERTING ENZYME: Angio Convert Enzyme: 23 U/L (ref 14–82)

## 2019-10-25 LAB — B. BURGDORFI ANTIBODIES: Lyme IgG/IgM Ab: <0.91 {ISR} (ref 0.00–0.90)

## 2019-10-25 LAB — RHEUMATOID FACTOR: Rhuematoid fact SerPl-aCnc: <10 IU/mL (ref 0.0–13.9)

## 2019-10-28 ENCOUNTER — Telehealth: Payer: Self-pay | Admitting: *Deleted

## 2019-10-28 NOTE — Telephone Encounter (Signed)
Called pt & LVM (ok per DPR) advising labs are unremarkable, no concerns noted by Dr. Jannifer Franklin. Left office number for pt to call back if she has any questions.

## 2019-11-10 ENCOUNTER — Ambulatory Visit: Payer: PPO | Attending: Internal Medicine

## 2019-11-10 DIAGNOSIS — Z23 Encounter for immunization: Secondary | ICD-10-CM | POA: Insufficient documentation

## 2019-11-10 NOTE — Progress Notes (Signed)
   Covid-19 Vaccination Clinic  Name:  [name]    MRN: [id number] DOB: [birth year]  11/10/2019  [name] was observed post Covid-19 immunization for 15 minutes without incidence. She was provided with Vaccine Information Sheet and instruction to access the V-Safe system.   [name] was instructed to call 911 with any severe reactions post vaccine: Marland Kitchen Difficulty breathing  . Swelling of your face and throat  . A fast heartbeat  . A bad rash all over your body  . Dizziness and weakness    Immunizations Administered    Name Date Dose VIS Date Route   Pfizer COVID-19 Vaccine 11/10/2019  4:42 PM 0.3 mL 09/13/2019 Intramuscular   Manufacturer: [place]   Lot: [id number]   [place]: [id number]

## 2019-11-14 DIAGNOSIS — Z0000 Encounter for general adult medical examination without abnormal findings: Secondary | ICD-10-CM | POA: Diagnosis not present

## 2019-11-14 DIAGNOSIS — R198 Other specified symptoms and signs involving the digestive system and abdomen: Secondary | ICD-10-CM | POA: Diagnosis not present

## 2019-11-14 DIAGNOSIS — I10 Essential (primary) hypertension: Secondary | ICD-10-CM | POA: Diagnosis not present

## 2019-11-14 DIAGNOSIS — M79662 Pain in left lower leg: Secondary | ICD-10-CM | POA: Diagnosis not present

## 2019-11-14 DIAGNOSIS — J301 Allergic rhinitis due to pollen: Secondary | ICD-10-CM | POA: Diagnosis not present

## 2019-11-14 DIAGNOSIS — E7800 Pure hypercholesterolemia, unspecified: Secondary | ICD-10-CM | POA: Diagnosis not present

## 2019-11-14 DIAGNOSIS — L659 Nonscarring hair loss, unspecified: Secondary | ICD-10-CM | POA: Diagnosis not present

## 2019-11-14 DIAGNOSIS — Z1389 Encounter for screening for other disorder: Secondary | ICD-10-CM | POA: Diagnosis not present

## 2019-11-18 ENCOUNTER — Telehealth: Payer: Self-pay | Admitting: Neurology

## 2019-11-18 DIAGNOSIS — G8929 Other chronic pain: Secondary | ICD-10-CM

## 2019-11-18 NOTE — Telephone Encounter (Signed)
Pt is asking for a call from RN to discuss the recommendation of a possible increase or change in strength of her gabapentin (NEURONTIN) 100 MG capsule.  Please call

## 2019-11-19 NOTE — Telephone Encounter (Signed)
I called the patient.  The patient has not gotten benefit from low-dose gabapentin, she will go up to 200 mg 3 times a day, if she still tolerates the drug but needs more, I will call in a 300 mg capsule, she will let me know about this.

## 2019-11-19 NOTE — Telephone Encounter (Signed)
If patient calls back please ask her to give details on why she wants to change dosage or increase dosage of gabapentin. I left vm for patient to call back.

## 2019-11-19 NOTE — Telephone Encounter (Signed)
Patient called stating at her last visit she was advised by dr.willis to begin the medication on a light dosage to see if she can tolerate it and if she believes it wasn't helping she could CB for further consideration  Patient states she has seen no improvement while taking over the last three weeks and was advised that if gabapentin didn't work an MRI might also be considered and would like to know if she will need to try a dosage change or if she will just need to get an MRI   If MRI is needed she would like to get financial information to verify coverage.

## 2019-11-27 ENCOUNTER — Ambulatory Visit: Payer: PPO

## 2019-12-05 ENCOUNTER — Ambulatory Visit: Payer: PPO

## 2019-12-12 DIAGNOSIS — Z5181 Encounter for therapeutic drug level monitoring: Secondary | ICD-10-CM | POA: Diagnosis not present

## 2019-12-12 DIAGNOSIS — I10 Essential (primary) hypertension: Secondary | ICD-10-CM | POA: Diagnosis not present

## 2019-12-12 DIAGNOSIS — E7800 Pure hypercholesterolemia, unspecified: Secondary | ICD-10-CM | POA: Diagnosis not present

## 2019-12-16 ENCOUNTER — Other Ambulatory Visit: Payer: Self-pay | Admitting: Internal Medicine

## 2019-12-16 ENCOUNTER — Telehealth: Payer: Self-pay | Admitting: Neurology

## 2019-12-16 ENCOUNTER — Ambulatory Visit: Payer: PPO | Attending: Internal Medicine

## 2019-12-16 DIAGNOSIS — E7800 Pure hypercholesterolemia, unspecified: Secondary | ICD-10-CM

## 2019-12-16 DIAGNOSIS — Z23 Encounter for immunization: Secondary | ICD-10-CM

## 2019-12-16 MED ORDER — ALPRAZOLAM 0.5 MG PO TABS: ORAL_TABLET | ORAL | 0 refills | Status: AC

## 2019-12-16 MED ORDER — GABAPENTIN 100 MG PO CAPS: 200.0000 mg | ORAL_CAPSULE | Freq: Three times a day (TID) | ORAL | 3 refills | Status: DC

## 2019-12-16 MED ORDER — DULOXETINE HCL 30 MG PO CPEP: 30.0000 mg | ORAL_CAPSULE | Freq: Every day | ORAL | 3 refills | Status: AC

## 2019-12-16 NOTE — Telephone Encounter (Signed)
I called the patient, I left a message.  I will call in a prescription for the gabapentin to cover the increase in dosing.

## 2019-12-16 NOTE — Telephone Encounter (Addendum)
Patient called requesting a CB to provide update on her gabapentin

## 2019-12-16 NOTE — Telephone Encounter (Signed)
I called the patient.  The patient believes that the gabapentin is not helpful.  She will stop the medication, and we will call in Cymbalta in low-dose taking 30 mg daily.  We will go ahead and order MRI of the low back.  The patient needs Xanax for the scan.

## 2019-12-16 NOTE — Telephone Encounter (Signed)
Pt has called and Dr Tobey Grim last message was relayed to her.  Pt states she is not in favor of the dose going up on the gabapentin because she has not noticed any improvement in her leg pain.  The dose/stregnth she is on makes her sleep, causes her not to function as she normally does.  Pt state she is going out of town on [place] and states if Dr Jannifer Franklin wants her to stay on this regimen a while longer she will but she is not in favor of taking a lot of pills.  Please call.

## 2019-12-16 NOTE — Telephone Encounter (Signed)
Phone rep checked office voicemail's, @ 9:51 this a.m. pt left message stating that her pharmacy is calling re: her script for her gabapentin. [facility] #[id number]   Pt states Dr Jannifer Franklin doubled her gabapentin amount and that is likely what the pharmacy is calling to confirm before filling.  Pt stated she is going out of town tomorrow and is needing this filled by tomorrow.  Please call

## 2019-12-16 NOTE — Addendum Note (Signed)
Addended by: Kathrynn Ducking on: 12/16/2019 12:44 PM   Modules accepted: Orders

## 2019-12-16 NOTE — Addendum Note (Signed)
Addended by: Kathrynn Ducking on: 12/16/2019 02:17 PM   Modules accepted: Orders

## 2019-12-16 NOTE — Telephone Encounter (Signed)
health team order sent to [facility]. No auth they will reach out to the patient to schedule.  

## 2019-12-16 NOTE — Progress Notes (Signed)
   Covid-19 Vaccination Clinic  Name:  VAL TIBERI    MRN: [id number] DOB: December 28, 1950  12/16/2019  Ms. Colglazier was observed post Covid-19 immunization for 15 minutes without incident. She was provided with Vaccine Information Sheet and instruction to access the V-Safe system.   Ms. Maben was instructed to call 911 with any severe reactions post vaccine: Marland Kitchen Difficulty breathing  . Swelling of face and throat  . A fast heartbeat  . A bad rash all over body  . Dizziness and weakness   Immunizations Administered    Name Date Dose VIS Date Route   Pfizer COVID-19 Vaccine 12/16/2019 11:43 AM 0.3 mL 09/13/2019 Intramuscular   Manufacturer: [place]   Lot: [id number]   [place]: [id number]

## 2019-12-17 DIAGNOSIS — Z5181 Encounter for therapeutic drug level monitoring: Secondary | ICD-10-CM | POA: Diagnosis not present

## 2019-12-17 DIAGNOSIS — M25562 Pain in left knee: Secondary | ICD-10-CM | POA: Diagnosis not present

## 2020-01-03 ENCOUNTER — Other Ambulatory Visit: Payer: Self-pay | Admitting: Internal Medicine

## 2020-01-07 ENCOUNTER — Other Ambulatory Visit: Payer: PPO

## 2020-01-13 DIAGNOSIS — M25562 Pain in left knee: Secondary | ICD-10-CM | POA: Diagnosis not present

## 2020-01-15 ENCOUNTER — Other Ambulatory Visit: Payer: PPO

## 2020-01-19 ENCOUNTER — Other Ambulatory Visit: Payer: Self-pay

## 2020-01-19 ENCOUNTER — Ambulatory Visit
Admission: RE | Admit: 2020-01-19 | Discharge: 2020-01-19 | Disposition: A | Discharge status: Home / Self Care | Payer: PPO | Source: Ambulatory Visit | Attending: Neurology | Admitting: Neurology

## 2020-01-19 DIAGNOSIS — M545 Low back pain, unspecified: Secondary | ICD-10-CM

## 2020-01-19 DIAGNOSIS — G8929 Other chronic pain: Secondary | ICD-10-CM

## 2020-01-20 ENCOUNTER — Telehealth: Payer: Self-pay | Admitting: Neurology

## 2020-01-20 ENCOUNTER — Other Ambulatory Visit: Payer: PPO

## 2020-01-20 NOTE — Telephone Encounter (Signed)
I called the patient.  MRI of the low back shows some degenerative changes at the L3-4, L4-5, and L5-S1 levels, some encroachment on the left L4 and bilateral L5 nerve root, no definite pinched nerve.  I have recommended an epidural steroid injection to see if this improves her symptoms, she had an injection in 2006 through Dr. Erling Cruz.  If she wishes to have the injection, she will call our office.   MRI lumbar 01/19/20:  IMPRESSION: This MRI of the lumbar spine shows the following: 1.   At L3-L4, there are disc degenerative changes causing moderate left foraminal and lateral recess stenosis.  The degenerative changes encroach upon the left L4 nerve root without causing definite nerve root compression. 2.   At L4-L5, there is minimal anterolisthesis and other degenerative changes causing mild spinal stenosis and moderate bilateral lateral recess stenosis.  There are some encroachment upon the L5 nerve roots but no definite nerve root compression. 3.   At L5-S1, there is 1 to 2 mm retrolisthesis and other degenerative changes causing moderately severe foraminal narrowing bilaterally with potential for L5 nerve root compression.

## 2020-02-04 ENCOUNTER — Ambulatory Visit
Admission: RE | Admit: 2020-02-04 | Discharge: 2020-02-04 | Disposition: A | Discharge status: Home / Self Care | Payer: No Typology Code available for payment source | Source: Ambulatory Visit | Attending: Internal Medicine | Admitting: Internal Medicine

## 2020-02-04 DIAGNOSIS — E7800 Pure hypercholesterolemia, unspecified: Secondary | ICD-10-CM

## 2020-02-07 DIAGNOSIS — M25562 Pain in left knee: Secondary | ICD-10-CM | POA: Diagnosis not present

## 2020-02-17 DIAGNOSIS — Z1231 Encounter for screening mammogram for malignant neoplasm of breast: Secondary | ICD-10-CM | POA: Diagnosis not present

## 2020-02-17 DIAGNOSIS — M25562 Pain in left knee: Secondary | ICD-10-CM | POA: Diagnosis not present

## 2020-02-17 DIAGNOSIS — Z01419 Encounter for gynecological examination (general) (routine) without abnormal findings: Secondary | ICD-10-CM | POA: Diagnosis not present

## 2020-02-24 ENCOUNTER — Ambulatory Visit: Payer: PPO | Admitting: Neurology

## 2020-03-04 ENCOUNTER — Telehealth: Payer: Self-pay | Admitting: Neurology

## 2020-03-04 ENCOUNTER — Other Ambulatory Visit: Payer: Self-pay

## 2020-03-04 NOTE — Telephone Encounter (Signed)
:   Signed    If patient calls back tell her Dr. Arlie Solomons spoke with her on 01/20/2020 about MRi of low back and recommend an injection.   I left vm for patient to call back during our business hours.

## 2020-03-04 NOTE — Progress Notes (Signed)
If patient calls back tell her Dr. Arlie Solomons spoke with her on 01/20/2020 about MRi of low back and recommend an injection.   I left vm for patient to call back during our business hours.

## 2020-03-04 NOTE — Telephone Encounter (Signed)
Pt called wanting to discuss several questions she has about her MRI results with RN. Please advise.

## 2020-03-05 NOTE — Telephone Encounter (Signed)
I called pt back about her questions. Pt stated she will set up her mychart and send her questions. PT stated she is not interested in getting a epidural shot for her lower back pain. She did receive the detail vm from Dr. Jannifer Franklin in April 2021. She has been seeing orthopedic for her left knee pain and she has a torn menisus. She will be having surgery on left knee  April 02, 2020. She wants to know if her lower back pain be causing the left leg pain. She reported having back pain for years. She had  A EMG and was told it was neuropathy. She has questions for Dr. Eugenie Birks when he returns to office. I stated message will be sent to him. I advise pt to send over the MRI of her knee and office notes from orthopedic for Dr. Jannifer Franklin to review. She verbalized understanding.

## 2020-03-05 NOTE — Telephone Encounter (Signed)
I called the patient.  The MRI of the back suggested the potential for encroachment of the left L4 and bilateral L5 nerve roots.  This could explain some of the pain going down the left leg.  At this time, the patient does not wish to undergo an epidural steroid injection.  If she changes her mind, we can get this done.  The patient is being evaluated for left knee issues as well.

## 2020-03-30 DIAGNOSIS — I10 Essential (primary) hypertension: Secondary | ICD-10-CM | POA: Diagnosis not present

## 2020-03-30 DIAGNOSIS — S83242D Other tear of medial meniscus, current injury, left knee, subsequent encounter: Secondary | ICD-10-CM | POA: Diagnosis not present

## 2020-03-30 DIAGNOSIS — E7800 Pure hypercholesterolemia, unspecified: Secondary | ICD-10-CM | POA: Diagnosis not present

## 2020-03-30 DIAGNOSIS — R911 Solitary pulmonary nodule: Secondary | ICD-10-CM | POA: Diagnosis not present

## 2020-04-02 DIAGNOSIS — Y999 Unspecified external cause status: Secondary | ICD-10-CM | POA: Diagnosis not present

## 2020-04-02 DIAGNOSIS — M2242 Chondromalacia patellae, left knee: Secondary | ICD-10-CM | POA: Diagnosis not present

## 2020-04-02 DIAGNOSIS — S83232A Complex tear of medial meniscus, current injury, left knee, initial encounter: Secondary | ICD-10-CM | POA: Diagnosis not present

## 2020-04-02 DIAGNOSIS — G8918 Other acute postprocedural pain: Secondary | ICD-10-CM | POA: Diagnosis not present

## 2020-04-02 DIAGNOSIS — X58XXXA Exposure to other specified factors, initial encounter: Secondary | ICD-10-CM | POA: Diagnosis not present

## 2020-04-10 DIAGNOSIS — Z9889 Other specified postprocedural states: Secondary | ICD-10-CM | POA: Diagnosis not present

## 2020-04-14 DIAGNOSIS — M2242 Chondromalacia patellae, left knee: Secondary | ICD-10-CM | POA: Diagnosis not present

## 2020-04-14 DIAGNOSIS — S83212D Bucket-handle tear of medial meniscus, current injury, left knee, subsequent encounter: Secondary | ICD-10-CM | POA: Diagnosis not present

## 2020-04-15 DIAGNOSIS — M2242 Chondromalacia patellae, left knee: Secondary | ICD-10-CM | POA: Diagnosis not present

## 2020-04-15 DIAGNOSIS — S83212D Bucket-handle tear of medial meniscus, current injury, left knee, subsequent encounter: Secondary | ICD-10-CM | POA: Diagnosis not present

## 2020-04-20 DIAGNOSIS — M2242 Chondromalacia patellae, left knee: Secondary | ICD-10-CM | POA: Diagnosis not present

## 2020-04-20 DIAGNOSIS — S83212D Bucket-handle tear of medial meniscus, current injury, left knee, subsequent encounter: Secondary | ICD-10-CM | POA: Diagnosis not present

## 2020-04-23 DIAGNOSIS — M2242 Chondromalacia patellae, left knee: Secondary | ICD-10-CM | POA: Diagnosis not present

## 2020-04-23 DIAGNOSIS — S83212D Bucket-handle tear of medial meniscus, current injury, left knee, subsequent encounter: Secondary | ICD-10-CM | POA: Diagnosis not present

## 2020-04-27 DIAGNOSIS — S83212D Bucket-handle tear of medial meniscus, current injury, left knee, subsequent encounter: Secondary | ICD-10-CM | POA: Diagnosis not present

## 2020-04-27 DIAGNOSIS — M2242 Chondromalacia patellae, left knee: Secondary | ICD-10-CM | POA: Diagnosis not present

## 2020-04-29 DIAGNOSIS — S83212D Bucket-handle tear of medial meniscus, current injury, left knee, subsequent encounter: Secondary | ICD-10-CM | POA: Diagnosis not present

## 2020-04-29 DIAGNOSIS — M2242 Chondromalacia patellae, left knee: Secondary | ICD-10-CM | POA: Diagnosis not present

## 2020-05-01 DIAGNOSIS — Z9889 Other specified postprocedural states: Secondary | ICD-10-CM | POA: Diagnosis not present

## 2020-05-04 DIAGNOSIS — M2242 Chondromalacia patellae, left knee: Secondary | ICD-10-CM | POA: Diagnosis not present

## 2020-05-04 DIAGNOSIS — S83212D Bucket-handle tear of medial meniscus, current injury, left knee, subsequent encounter: Secondary | ICD-10-CM | POA: Diagnosis not present

## 2020-05-06 DIAGNOSIS — S83212D Bucket-handle tear of medial meniscus, current injury, left knee, subsequent encounter: Secondary | ICD-10-CM | POA: Diagnosis not present

## 2020-05-06 DIAGNOSIS — M2242 Chondromalacia patellae, left knee: Secondary | ICD-10-CM | POA: Diagnosis not present

## 2020-05-11 DIAGNOSIS — S83212D Bucket-handle tear of medial meniscus, current injury, left knee, subsequent encounter: Secondary | ICD-10-CM | POA: Diagnosis not present

## 2020-05-11 DIAGNOSIS — M2242 Chondromalacia patellae, left knee: Secondary | ICD-10-CM | POA: Diagnosis not present

## 2020-05-14 DIAGNOSIS — S83212D Bucket-handle tear of medial meniscus, current injury, left knee, subsequent encounter: Secondary | ICD-10-CM | POA: Diagnosis not present

## 2020-05-14 DIAGNOSIS — M2242 Chondromalacia patellae, left knee: Secondary | ICD-10-CM | POA: Diagnosis not present

## 2020-05-18 DIAGNOSIS — M2242 Chondromalacia patellae, left knee: Secondary | ICD-10-CM | POA: Diagnosis not present

## 2020-05-18 DIAGNOSIS — S83212D Bucket-handle tear of medial meniscus, current injury, left knee, subsequent encounter: Secondary | ICD-10-CM | POA: Diagnosis not present

## 2020-05-22 DIAGNOSIS — S83212D Bucket-handle tear of medial meniscus, current injury, left knee, subsequent encounter: Secondary | ICD-10-CM | POA: Diagnosis not present

## 2020-05-22 DIAGNOSIS — M2242 Chondromalacia patellae, left knee: Secondary | ICD-10-CM | POA: Diagnosis not present

## 2020-05-25 DIAGNOSIS — S83212D Bucket-handle tear of medial meniscus, current injury, left knee, subsequent encounter: Secondary | ICD-10-CM | POA: Diagnosis not present

## 2020-05-25 DIAGNOSIS — M2242 Chondromalacia patellae, left knee: Secondary | ICD-10-CM | POA: Diagnosis not present

## 2020-05-30 DIAGNOSIS — Z23 Encounter for immunization: Secondary | ICD-10-CM | POA: Diagnosis not present

## 2020-06-01 DIAGNOSIS — Z9889 Other specified postprocedural states: Secondary | ICD-10-CM | POA: Diagnosis not present

## 2020-06-01 DIAGNOSIS — S83212D Bucket-handle tear of medial meniscus, current injury, left knee, subsequent encounter: Secondary | ICD-10-CM | POA: Diagnosis not present

## 2020-06-01 DIAGNOSIS — M2242 Chondromalacia patellae, left knee: Secondary | ICD-10-CM | POA: Diagnosis not present

## 2020-06-04 DIAGNOSIS — M2242 Chondromalacia patellae, left knee: Secondary | ICD-10-CM | POA: Diagnosis not present

## 2020-06-04 DIAGNOSIS — S83212D Bucket-handle tear of medial meniscus, current injury, left knee, subsequent encounter: Secondary | ICD-10-CM | POA: Diagnosis not present

## 2020-07-03 DIAGNOSIS — M722 Plantar fascial fibromatosis: Secondary | ICD-10-CM | POA: Insufficient documentation

## 2020-07-15 DIAGNOSIS — H52203 Unspecified astigmatism, bilateral: Secondary | ICD-10-CM | POA: Diagnosis not present

## 2020-07-15 DIAGNOSIS — H43813 Vitreous degeneration, bilateral: Secondary | ICD-10-CM | POA: Diagnosis not present

## 2020-07-15 DIAGNOSIS — H10413 Chronic giant papillary conjunctivitis, bilateral: Secondary | ICD-10-CM | POA: Diagnosis not present

## 2020-07-29 ENCOUNTER — Other Ambulatory Visit: Payer: PPO

## 2020-07-29 DIAGNOSIS — Z20822 Contact with and (suspected) exposure to covid-19: Secondary | ICD-10-CM

## 2020-07-30 LAB — SARS-COV-2, NAA 2 DAY TAT

## 2020-07-30 LAB — NOVEL CORONAVIRUS, NAA: SARS-CoV-2, NAA: NOT DETECTED

## 2020-08-12 ENCOUNTER — Other Ambulatory Visit: Payer: Self-pay

## 2020-08-12 ENCOUNTER — Ambulatory Visit (INDEPENDENT_AMBULATORY_CARE_PROVIDER_SITE_OTHER): Payer: PPO

## 2020-08-12 ENCOUNTER — Ambulatory Visit: Payer: PPO | Admitting: Podiatry

## 2020-08-12 DIAGNOSIS — M722 Plantar fascial fibromatosis: Secondary | ICD-10-CM

## 2020-08-12 DIAGNOSIS — G5761 Lesion of plantar nerve, right lower limb: Secondary | ICD-10-CM | POA: Insufficient documentation

## 2020-08-12 DIAGNOSIS — E559 Vitamin D deficiency, unspecified: Secondary | ICD-10-CM | POA: Insufficient documentation

## 2020-08-12 DIAGNOSIS — J309 Allergic rhinitis, unspecified: Secondary | ICD-10-CM | POA: Insufficient documentation

## 2020-08-12 DIAGNOSIS — N393 Stress incontinence (female) (male): Secondary | ICD-10-CM | POA: Insufficient documentation

## 2020-08-12 DIAGNOSIS — I10 Essential (primary) hypertension: Secondary | ICD-10-CM | POA: Insufficient documentation

## 2020-08-12 DIAGNOSIS — E7800 Pure hypercholesterolemia, unspecified: Secondary | ICD-10-CM | POA: Insufficient documentation

## 2020-08-12 DIAGNOSIS — M7741 Metatarsalgia, right foot: Secondary | ICD-10-CM | POA: Insufficient documentation

## 2020-08-12 DIAGNOSIS — K589 Irritable bowel syndrome without diarrhea: Secondary | ICD-10-CM | POA: Insufficient documentation

## 2020-08-12 MED ORDER — MELOXICAM 15 MG PO TABS: 15.0000 mg | ORAL_TABLET | Freq: Every day | ORAL | 1 refills | Status: DC

## 2020-08-12 NOTE — Progress Notes (Signed)
   Subjective: 69 y.o. female presenting as a new patient for evaluation of left heel pain is been going on for approximately 6-8 weeks now.  Patient does have a history of plantar fasciitis in the past.  She has received injections in the past which have helped significantly and resolved her symptoms.  She is currently wearing custom molded orthotics to help alleviate her pain.   Past Medical History:  Diagnosis Date  . Adenomatous colon polyp   . Allergy    seasonal  . Constipation   . Diverticulosis   . Headache(784.0)   . Hyperlipidemia   . Hypertension   . Peripheral motor neuropathy 07/16/2019  . Proctitis 07/08/1998     Objective: Physical Exam General: The patient is alert and oriented x3 in no acute distress.  Dermatology: Skin is warm, dry and supple bilateral lower extremities. Negative for open lesions or macerations bilateral.   Vascular: Dorsalis Pedis and Posterior Tibial pulses palpable bilateral.  Capillary fill time is immediate to all digits.  Neurological: Epicritic and protective threshold intact bilateral.   Musculoskeletal: Tenderness to palpation to the plantar aspect of the left heel along the plantar fascia. All other joints range of motion within normal limits bilateral. Strength 5/5 in all groups bilateral.   Radiographic exam: Normal osseous mineralization. Joint spaces preserved. No fracture/dislocation/boney destruction. No other soft tissue abnormalities or radiopaque foreign bodies.  Posterior and plantar heel spur noted to the calcaneus on lateral view.  Assessment: 1. Plantar fasciitis left foot  Plan of Care:  1. Patient evaluated. Xrays reviewed.   2. Injection of 0.5cc Celestone soluspan injected into the left plantar fascia.  3.  Continue wearing custom molded orthotics that the patient has from several years prior 4. Rx for Meloxicam ordered for patient. 5.Instructed patient regarding therapies and modalities at home to alleviate  symptoms.  6. Return to clinic as needed   Edrick Kins, DPM [facility]  Dr. Edrick Kins, DPM    [address], [place] [address]                Office [phone]  Fax [phone]

## 2020-09-14 DIAGNOSIS — Z20822 Contact with and (suspected) exposure to covid-19: Secondary | ICD-10-CM | POA: Diagnosis not present

## 2020-10-08 ENCOUNTER — Other Ambulatory Visit: Payer: Self-pay | Admitting: Podiatry

## 2020-10-11 NOTE — Telephone Encounter (Signed)
Please advise 

## 2020-10-28 ENCOUNTER — Encounter: Payer: Self-pay | Admitting: Podiatry

## 2021-02-09 ENCOUNTER — Ambulatory Visit
Admission: RE | Admit: 2021-02-09 | Discharge: 2021-02-09 | Disposition: A | Discharge status: Home / Self Care | Payer: Medicare Other | Source: Ambulatory Visit | Attending: Physician Assistant | Admitting: Physician Assistant

## 2021-02-09 ENCOUNTER — Other Ambulatory Visit: Payer: Self-pay | Admitting: Physician Assistant

## 2021-02-09 DIAGNOSIS — K5901 Slow transit constipation: Secondary | ICD-10-CM

## 2021-02-09 DIAGNOSIS — R198 Other specified symptoms and signs involving the digestive system and abdomen: Secondary | ICD-10-CM

## 2021-03-30 ENCOUNTER — Other Ambulatory Visit: Payer: Self-pay | Admitting: Geriatric Medicine

## 2021-03-30 ENCOUNTER — Ambulatory Visit
Admission: RE | Admit: 2021-03-30 | Discharge: 2021-03-30 | Disposition: A | Discharge status: Home / Self Care | Payer: Medicare Other | Source: Ambulatory Visit | Attending: Geriatric Medicine | Admitting: Geriatric Medicine

## 2021-03-30 DIAGNOSIS — R634 Abnormal weight loss: Secondary | ICD-10-CM

## 2021-04-19 ENCOUNTER — Other Ambulatory Visit: Payer: Self-pay | Admitting: Physician Assistant

## 2021-04-19 DIAGNOSIS — R634 Abnormal weight loss: Secondary | ICD-10-CM

## 2021-04-19 DIAGNOSIS — R197 Diarrhea, unspecified: Secondary | ICD-10-CM

## 2021-04-26 ENCOUNTER — Ambulatory Visit
Admission: RE | Admit: 2021-04-26 | Discharge: 2021-04-26 | Disposition: A | Discharge status: Home / Self Care | Payer: Medicare Other | Source: Ambulatory Visit | Attending: Physician Assistant | Admitting: Physician Assistant

## 2021-04-26 DIAGNOSIS — R634 Abnormal weight loss: Secondary | ICD-10-CM

## 2021-04-26 DIAGNOSIS — R197 Diarrhea, unspecified: Secondary | ICD-10-CM

## 2021-04-26 MED ORDER — IOPAMIDOL (ISOVUE-300) INJECTION 61%
100.0000 mL | Freq: Once | INTRAVENOUS | Status: AC | PRN
  Administered 2021-04-26: 100 mL via INTRAVENOUS

## 2021-05-20 ENCOUNTER — Other Ambulatory Visit: Payer: Self-pay | Admitting: Obstetrics and Gynecology

## 2021-05-20 DIAGNOSIS — Z1231 Encounter for screening mammogram for malignant neoplasm of breast: Secondary | ICD-10-CM

## 2021-05-22 ENCOUNTER — Other Ambulatory Visit: Payer: Self-pay

## 2021-05-22 ENCOUNTER — Ambulatory Visit
Admission: RE | Admit: 2021-05-22 | Discharge: 2021-05-22 | Disposition: A | Discharge status: Home / Self Care | Payer: Medicare Other | Source: Ambulatory Visit | Attending: Obstetrics and Gynecology | Admitting: Obstetrics and Gynecology

## 2021-05-22 DIAGNOSIS — Z1231 Encounter for screening mammogram for malignant neoplasm of breast: Secondary | ICD-10-CM

## 2021-08-22 IMAGING — DX DG CHEST 2V
2 series · 2 of 2 positions shown · non-contrast
Comparison: None.

CLINICAL DATA: 69-year-old female with a history of weight loss

EXAM:
CHEST - 2 VIEW

[dg chest 2 view (1 of 2)]
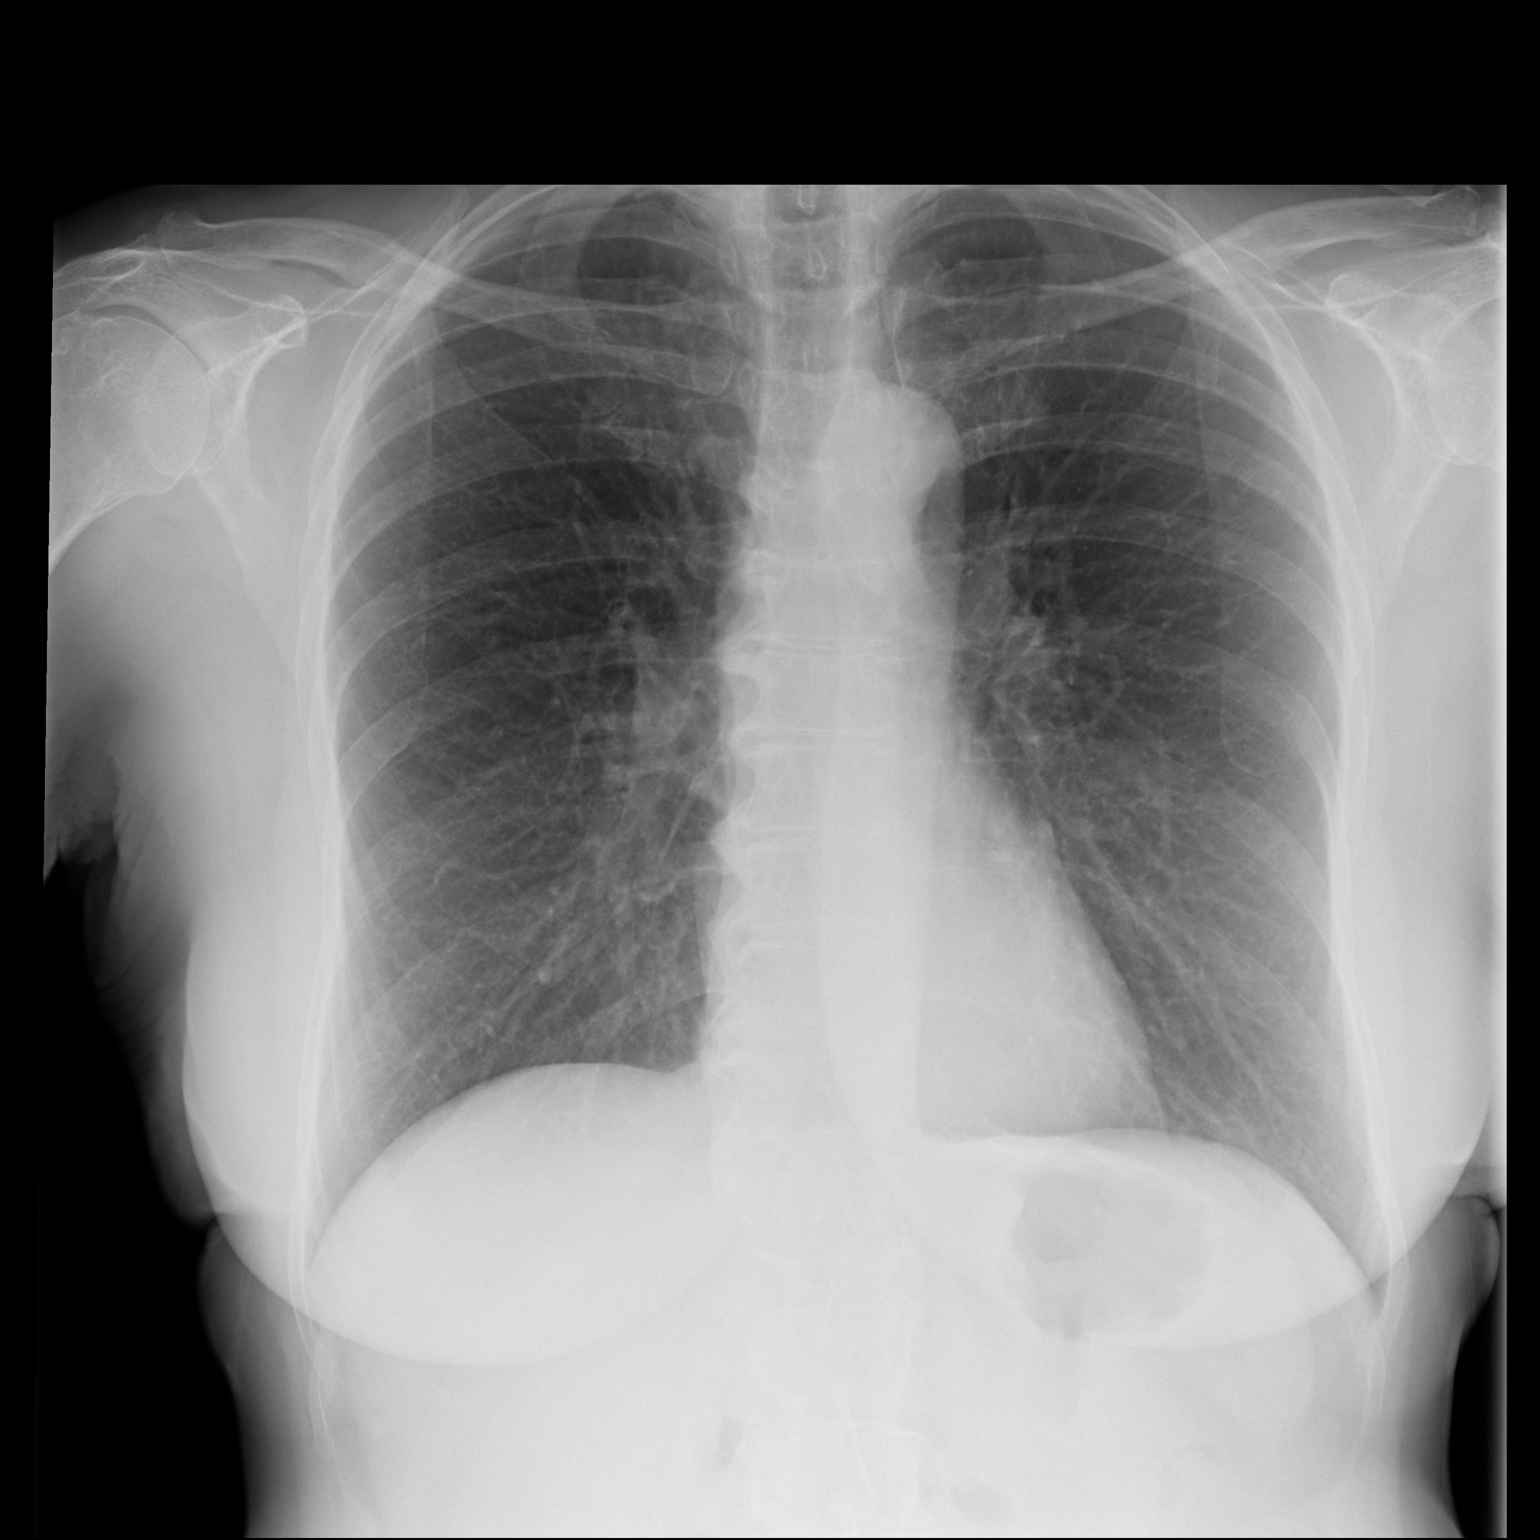

[dg chest 2 view (2 of 2)]
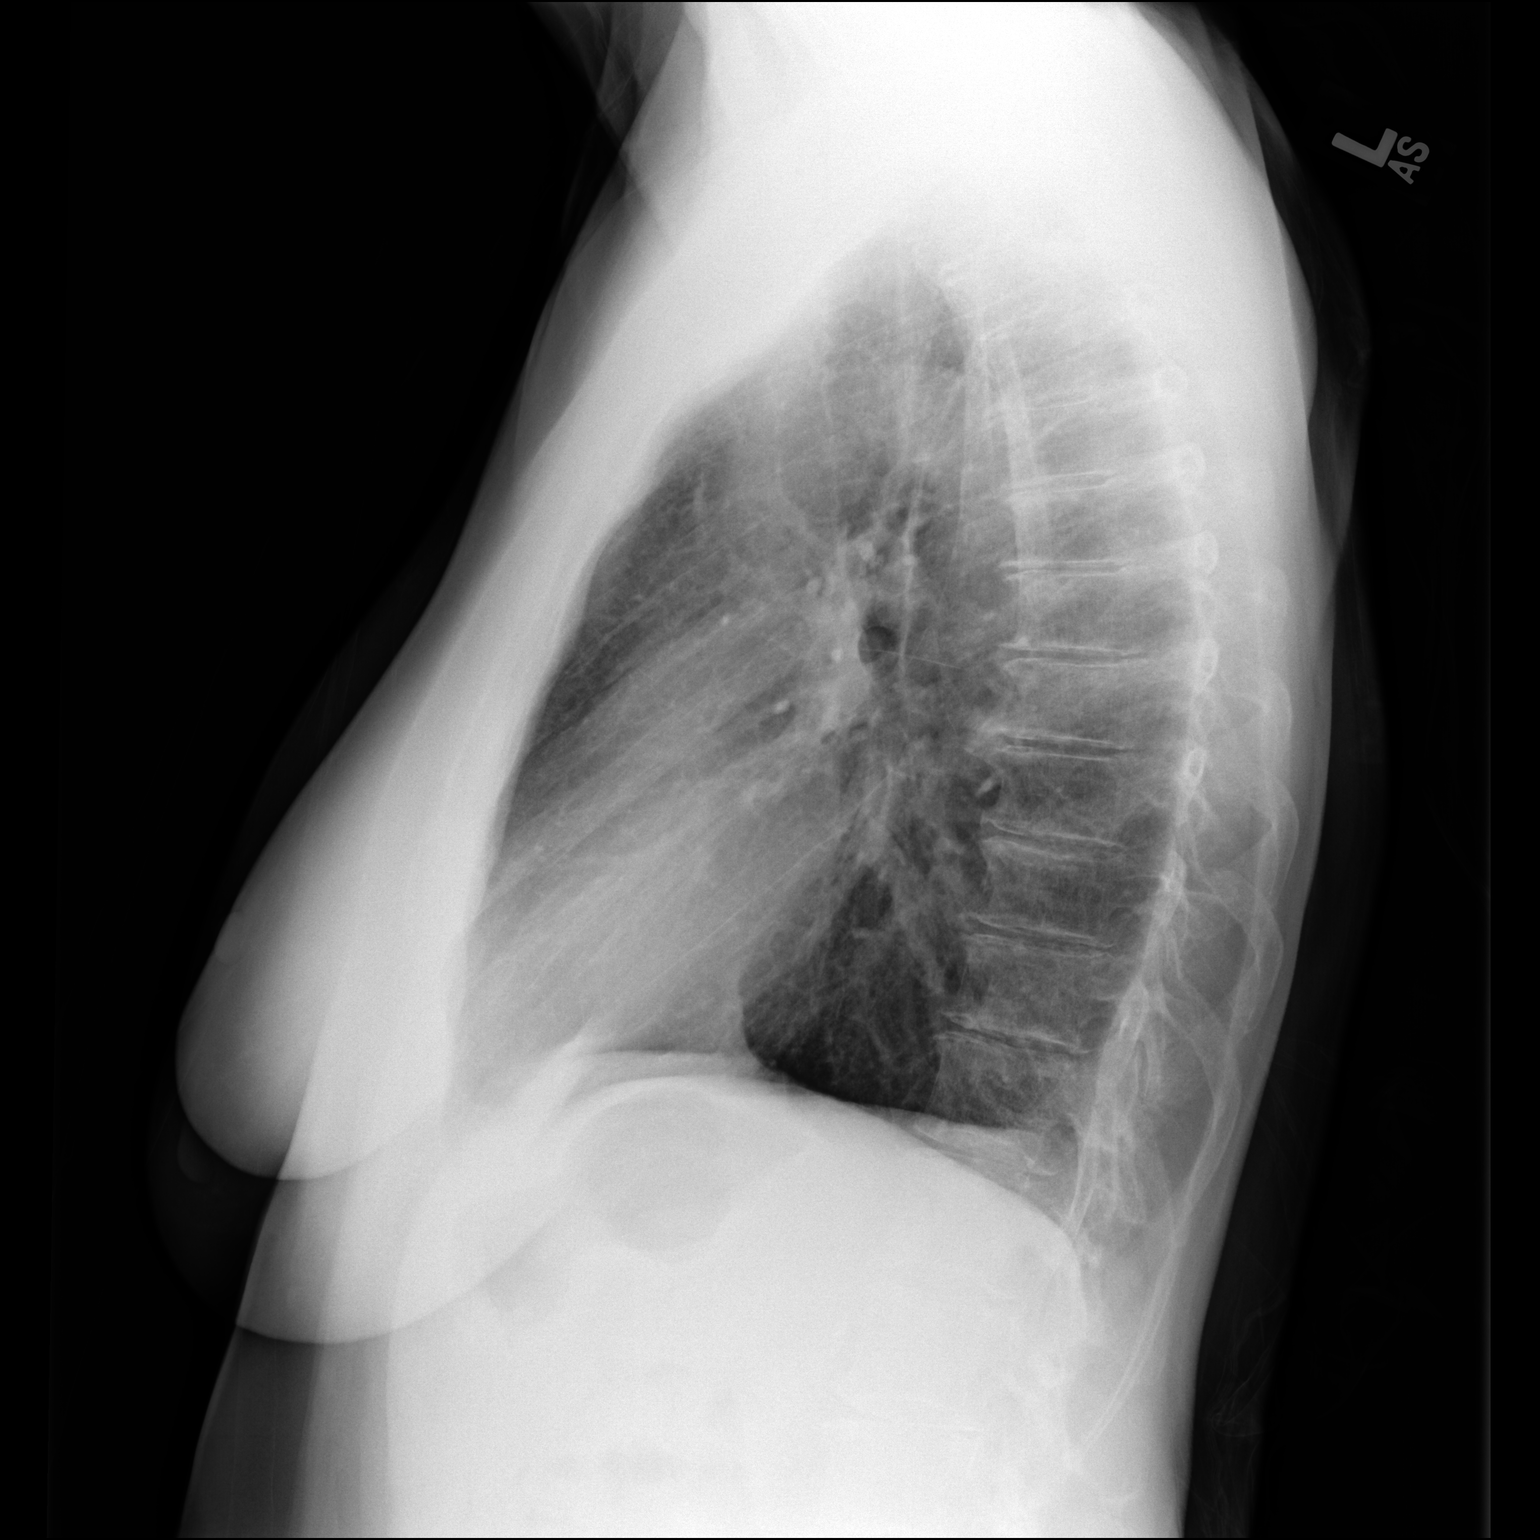

[2 of 2 positions shown; findings below may reference images not displayed]

FINDINGS: Cardiomediastinal silhouette within normal limits in size and
contour. No evidence of central vascular congestion. No interlobular
septal thickening. No pneumothorax or pleural effusion. No confluent
airspace disease.

No acute displaced fracture.

Mild degenerative changes of the spine.
IMPRESSION: Negative for acute cardiopulmonary disease.

## 2021-09-18 IMAGING — CT CT ABD-PELV W/ CM
2 of 5 series · 16 of 46 positions shown, 18 images · IV contrast (iopamidol)
Comparison: None

CLINICAL DATA: Diarrhea, unspecified type.  Weight loss

EXAM:
CT ABDOMEN AND PELVIS WITH CONTRAST
TECHNIQUE: Multidetector CT imaging of the abdomen and pelvis was performed
using the standard protocol following bolus administration of
intravenous contrast.
CONTRAST:  100mL 3NV0WE-8II IOPAMIDOL (3NV0WE-8II) INJECTION 61%

[Series 2: abd pelvis 5.00 br40 s3 axial · axial · 0.66mm/px · z∈[+1120,+1495]mm · 13 of 85 slices shown, 15 images]
[im 5/85  soft-tissue]
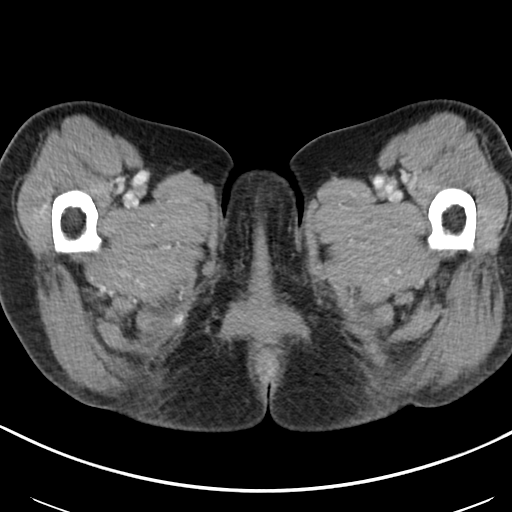
[im 5/85  bone]
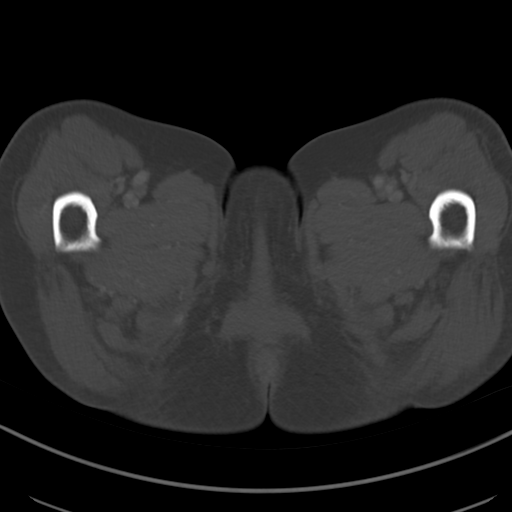
[im 10/85  soft-tissue]
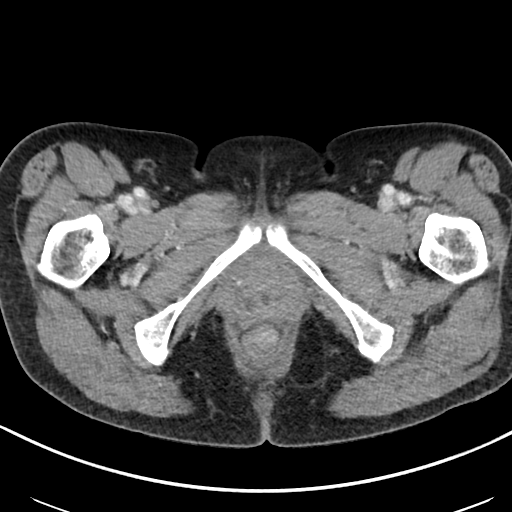
[im 19/85  soft-tissue]
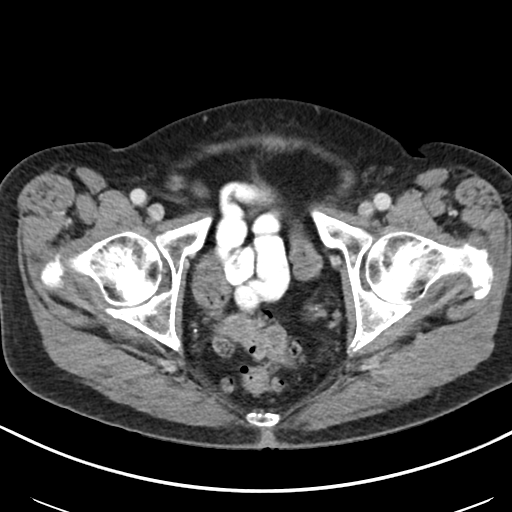
[im 24/85  soft-tissue]
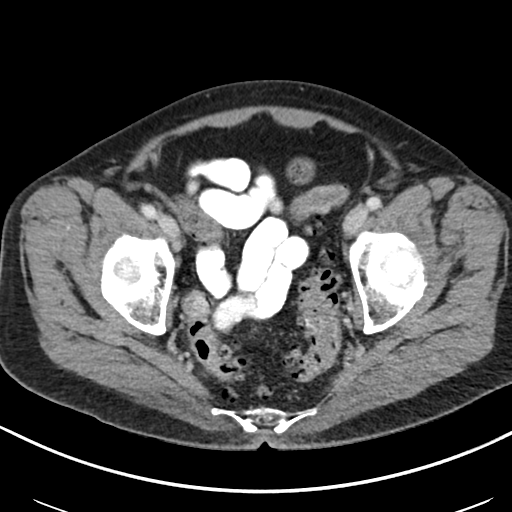
[im 29/85  soft-tissue]
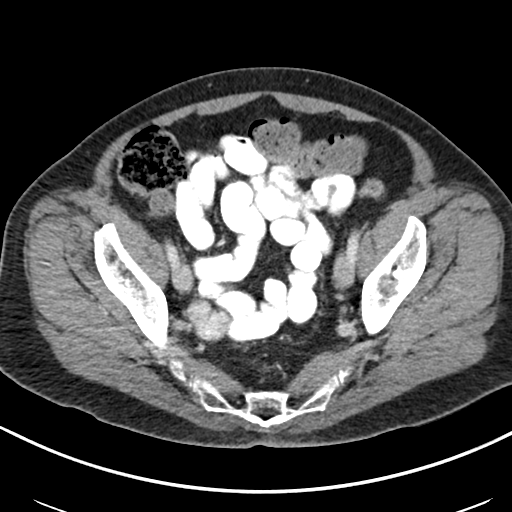
[im 38/85  soft-tissue]
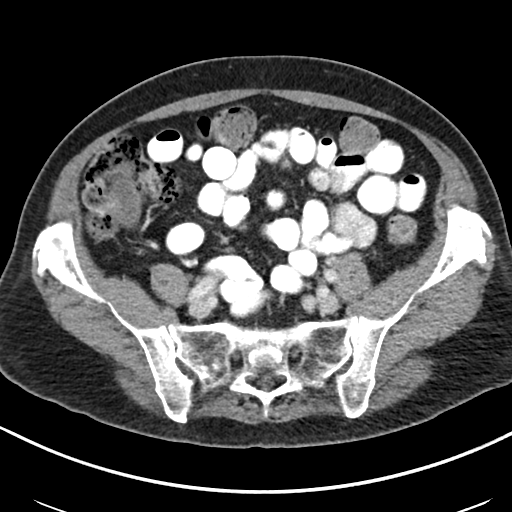
[im 43/85  soft-tissue]
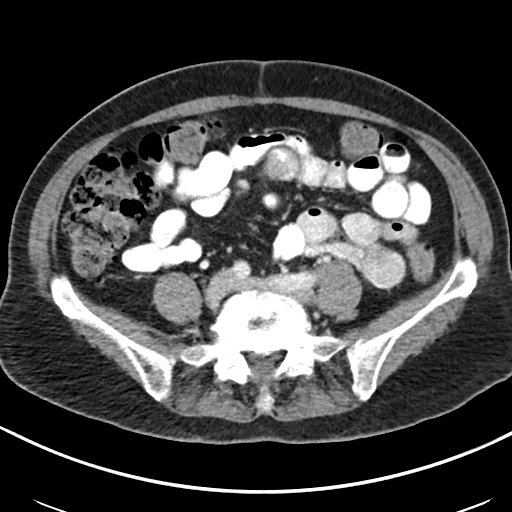
[im 47/85  soft-tissue]
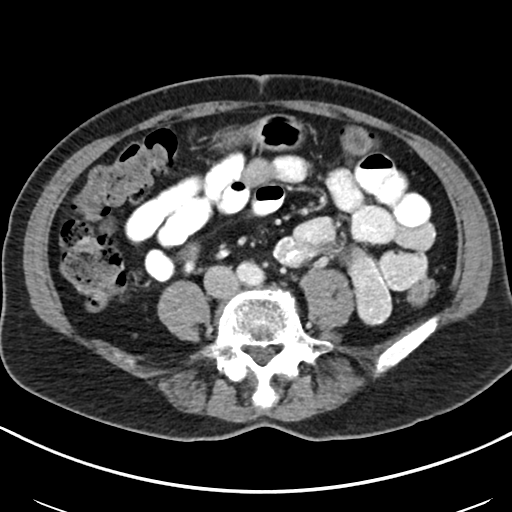
[im 57/85  soft-tissue]
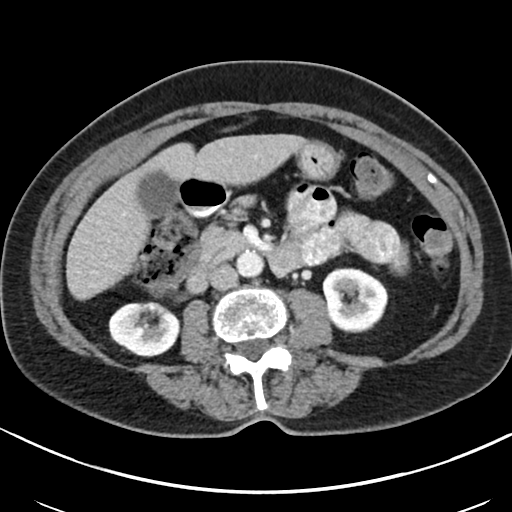
[im 57/85  bone]
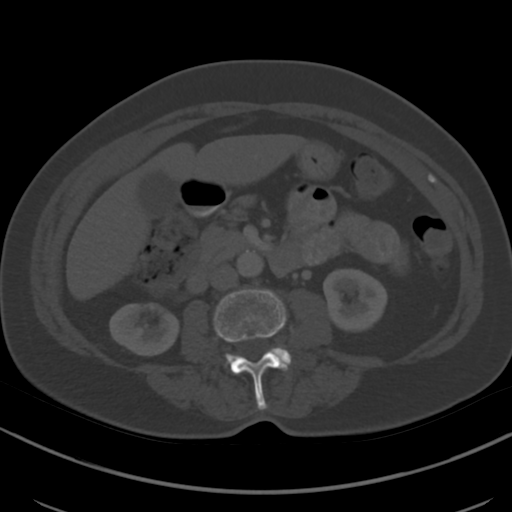
[im 61/85  soft-tissue]
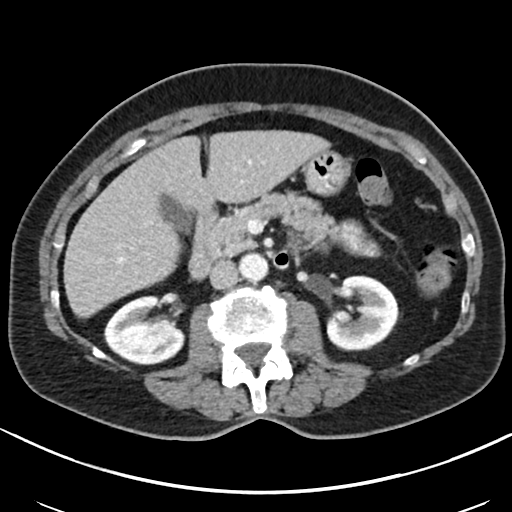
[im 66/85  soft-tissue]
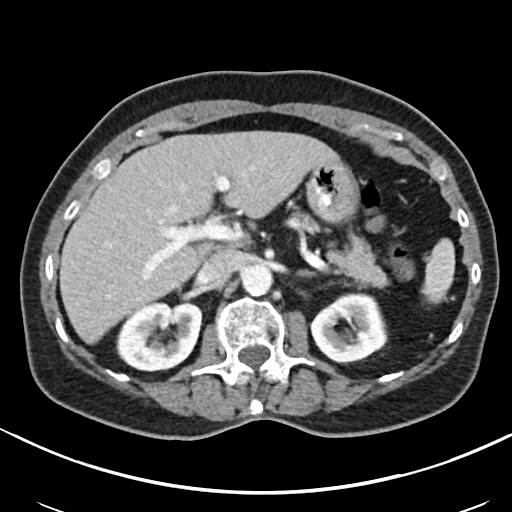
[im 75/85  soft-tissue]
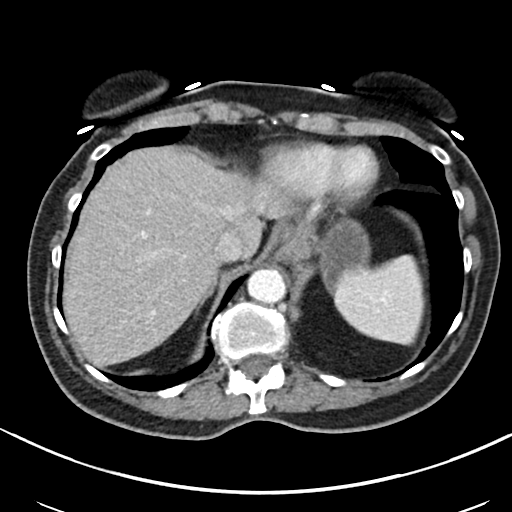
[im 80/85  soft-tissue]
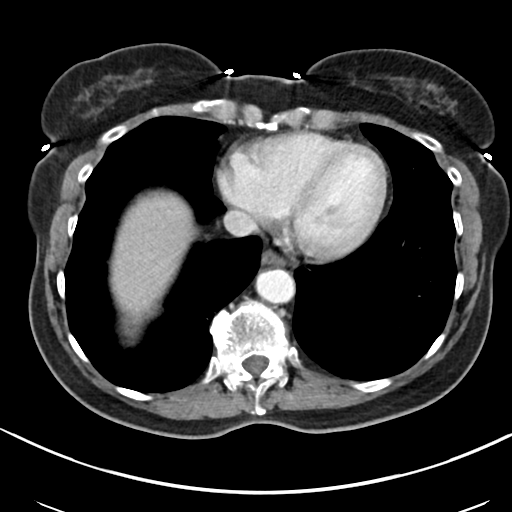

[Series 6: abd pelvis 2.00 br40 s3 cor · coronal · 0.66mm/px · 3 of 167 slices shown]
[im 74/167  soft-tissue]
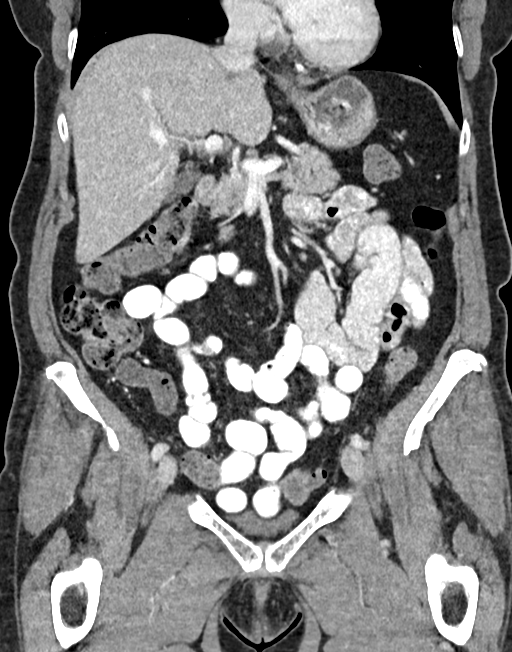
[im 93/167  soft-tissue]
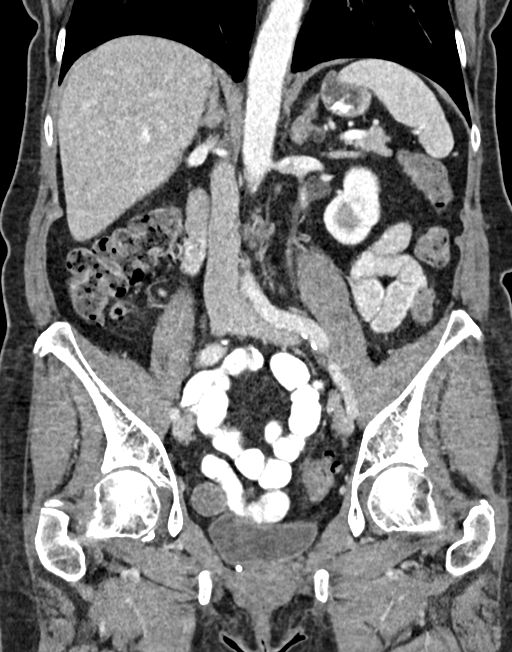
[im 111/167  soft-tissue]
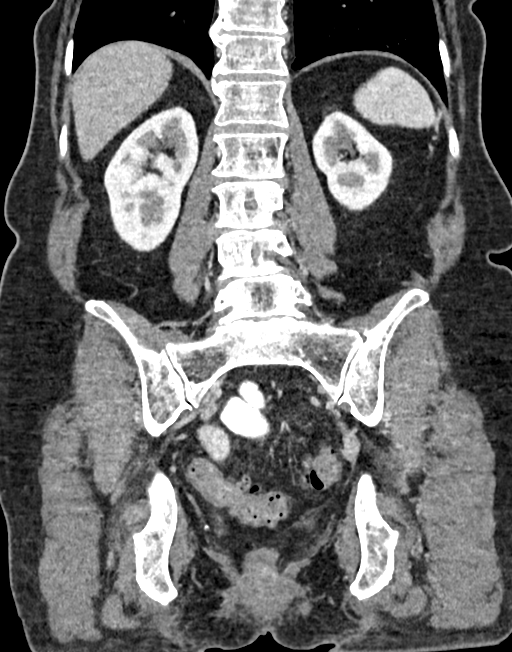

[16 of 46 positions shown; findings below may reference images not displayed]

FINDINGS: Lower chest: Lung bases are clear. No effusions. Heart is normal
size.

Hepatobiliary: No focal hepatic abnormality. Gallbladder
unremarkable.

Pancreas: No focal abnormality or ductal dilatation.

Spleen: No focal abnormality.  Normal size.

Adrenals/Urinary Tract: No adrenal abnormality. No focal renal
abnormality. No stones or hydronephrosis. Urinary bladder is
unremarkable.

Stomach/Bowel: Colonic diverticulosis. No active diverticulitis.
Stomach and small bowel decompressed, unremarkable. Moderate stool
throughout the colon.

Vascular/Lymphatic: No evidence of aneurysm or adenopathy.

Reproductive: Prior hysterectomy.  No adnexal masses.

Other: No free fluid or free air.

Musculoskeletal: No acute bony abnormality.
IMPRESSION: No acute findings in the abdomen or pelvis. No explanation for the
patient's symptoms.

Colonic diverticulosis.  Moderate stool throughout the colon.

## 2021-10-11 DIAGNOSIS — M25562 Pain in left knee: Secondary | ICD-10-CM | POA: Diagnosis not present

## 2021-10-11 DIAGNOSIS — M25511 Pain in right shoulder: Secondary | ICD-10-CM | POA: Diagnosis not present

## 2021-10-11 DIAGNOSIS — R531 Weakness: Secondary | ICD-10-CM | POA: Diagnosis not present

## 2021-10-11 DIAGNOSIS — M7541 Impingement syndrome of right shoulder: Secondary | ICD-10-CM | POA: Diagnosis not present

## 2021-10-21 DIAGNOSIS — M7541 Impingement syndrome of right shoulder: Secondary | ICD-10-CM | POA: Diagnosis not present

## 2021-10-21 DIAGNOSIS — R531 Weakness: Secondary | ICD-10-CM | POA: Diagnosis not present

## 2021-10-21 DIAGNOSIS — M25562 Pain in left knee: Secondary | ICD-10-CM | POA: Diagnosis not present

## 2021-10-21 DIAGNOSIS — M25511 Pain in right shoulder: Secondary | ICD-10-CM | POA: Diagnosis not present

## 2021-10-26 ENCOUNTER — Other Ambulatory Visit (HOSPITAL_COMMUNITY): Payer: Self-pay

## 2021-10-26 MED ORDER — TELMISARTAN 40 MG PO TABS
ORAL_TABLET | ORAL | 2 refills | Status: DC
  Filled 2021-10-26: qty 90, 90d supply, fill #0
  Filled 2022-02-15: qty 90, 90d supply, fill #1
  Filled 2022-05-17: qty 90, 90d supply, fill #2

## 2021-10-29 DIAGNOSIS — K589 Irritable bowel syndrome without diarrhea: Secondary | ICD-10-CM | POA: Diagnosis not present

## 2021-10-29 DIAGNOSIS — G5601 Carpal tunnel syndrome, right upper limb: Secondary | ICD-10-CM | POA: Diagnosis not present

## 2021-10-29 DIAGNOSIS — I7300 Raynaud's syndrome without gangrene: Secondary | ICD-10-CM | POA: Diagnosis not present

## 2021-10-29 DIAGNOSIS — I10 Essential (primary) hypertension: Secondary | ICD-10-CM | POA: Diagnosis not present

## 2021-10-29 DIAGNOSIS — J309 Allergic rhinitis, unspecified: Secondary | ICD-10-CM | POA: Diagnosis not present

## 2021-10-29 DIAGNOSIS — E7800 Pure hypercholesterolemia, unspecified: Secondary | ICD-10-CM | POA: Diagnosis not present

## 2021-11-01 DIAGNOSIS — M7541 Impingement syndrome of right shoulder: Secondary | ICD-10-CM | POA: Diagnosis not present

## 2021-11-01 DIAGNOSIS — M25511 Pain in right shoulder: Secondary | ICD-10-CM | POA: Diagnosis not present

## 2021-11-01 DIAGNOSIS — M25562 Pain in left knee: Secondary | ICD-10-CM | POA: Diagnosis not present

## 2021-11-01 DIAGNOSIS — R531 Weakness: Secondary | ICD-10-CM | POA: Diagnosis not present

## 2021-11-04 DIAGNOSIS — R531 Weakness: Secondary | ICD-10-CM | POA: Diagnosis not present

## 2021-11-04 DIAGNOSIS — M25562 Pain in left knee: Secondary | ICD-10-CM | POA: Diagnosis not present

## 2021-11-04 DIAGNOSIS — M7541 Impingement syndrome of right shoulder: Secondary | ICD-10-CM | POA: Diagnosis not present

## 2021-11-04 DIAGNOSIS — M25511 Pain in right shoulder: Secondary | ICD-10-CM | POA: Diagnosis not present

## 2021-11-05 DIAGNOSIS — R300 Dysuria: Secondary | ICD-10-CM | POA: Diagnosis not present

## 2021-11-07 DIAGNOSIS — N390 Urinary tract infection, site not specified: Secondary | ICD-10-CM | POA: Diagnosis not present

## 2021-11-07 DIAGNOSIS — R319 Hematuria, unspecified: Secondary | ICD-10-CM | POA: Diagnosis not present

## 2021-11-08 DIAGNOSIS — G5601 Carpal tunnel syndrome, right upper limb: Secondary | ICD-10-CM | POA: Diagnosis not present

## 2021-12-03 DIAGNOSIS — K5900 Constipation, unspecified: Secondary | ICD-10-CM | POA: Diagnosis not present

## 2021-12-08 DIAGNOSIS — G5601 Carpal tunnel syndrome, right upper limb: Secondary | ICD-10-CM | POA: Diagnosis not present

## 2021-12-27 DIAGNOSIS — K9289 Other specified diseases of the digestive system: Secondary | ICD-10-CM | POA: Diagnosis not present

## 2021-12-27 DIAGNOSIS — I10 Essential (primary) hypertension: Secondary | ICD-10-CM | POA: Diagnosis not present

## 2021-12-27 DIAGNOSIS — E539 Vitamin B deficiency, unspecified: Secondary | ICD-10-CM | POA: Diagnosis not present

## 2021-12-27 DIAGNOSIS — E559 Vitamin D deficiency, unspecified: Secondary | ICD-10-CM | POA: Diagnosis not present

## 2021-12-27 DIAGNOSIS — M8580 Other specified disorders of bone density and structure, unspecified site: Secondary | ICD-10-CM | POA: Diagnosis not present

## 2021-12-27 DIAGNOSIS — E7800 Pure hypercholesterolemia, unspecified: Secondary | ICD-10-CM | POA: Diagnosis not present

## 2021-12-27 DIAGNOSIS — K5900 Constipation, unspecified: Secondary | ICD-10-CM | POA: Diagnosis not present

## 2021-12-28 ENCOUNTER — Other Ambulatory Visit: Payer: Self-pay | Admitting: Internal Medicine

## 2021-12-28 DIAGNOSIS — M8580 Other specified disorders of bone density and structure, unspecified site: Secondary | ICD-10-CM

## 2021-12-31 ENCOUNTER — Ambulatory Visit
Admission: RE | Admit: 2021-12-31 | Discharge: 2021-12-31 | Disposition: A | Discharge status: Home / Self Care | Payer: HMO | Source: Ambulatory Visit | Attending: Gastroenterology | Admitting: Gastroenterology

## 2021-12-31 ENCOUNTER — Other Ambulatory Visit: Payer: Self-pay | Admitting: Gastroenterology

## 2021-12-31 DIAGNOSIS — K5900 Constipation, unspecified: Secondary | ICD-10-CM | POA: Diagnosis not present

## 2021-12-31 DIAGNOSIS — R198 Other specified symptoms and signs involving the digestive system and abdomen: Secondary | ICD-10-CM

## 2022-01-11 ENCOUNTER — Other Ambulatory Visit: Payer: HMO

## 2022-01-11 DIAGNOSIS — K5900 Constipation, unspecified: Secondary | ICD-10-CM | POA: Diagnosis not present

## 2022-01-11 DIAGNOSIS — R634 Abnormal weight loss: Secondary | ICD-10-CM | POA: Diagnosis not present

## 2022-01-12 DIAGNOSIS — F419 Anxiety disorder, unspecified: Secondary | ICD-10-CM | POA: Diagnosis not present

## 2022-02-08 DIAGNOSIS — N8110 Cystocele, unspecified: Secondary | ICD-10-CM | POA: Diagnosis not present

## 2022-02-08 DIAGNOSIS — Z01411 Encounter for gynecological examination (general) (routine) with abnormal findings: Secondary | ICD-10-CM | POA: Diagnosis not present

## 2022-02-08 DIAGNOSIS — K5900 Constipation, unspecified: Secondary | ICD-10-CM | POA: Diagnosis not present

## 2022-02-08 DIAGNOSIS — N9089 Other specified noninflammatory disorders of vulva and perineum: Secondary | ICD-10-CM | POA: Diagnosis not present

## 2022-02-15 ENCOUNTER — Other Ambulatory Visit (HOSPITAL_COMMUNITY): Payer: Self-pay

## 2022-02-16 ENCOUNTER — Other Ambulatory Visit (HOSPITAL_COMMUNITY): Payer: Self-pay

## 2022-02-16 DIAGNOSIS — K5900 Constipation, unspecified: Secondary | ICD-10-CM | POA: Diagnosis not present

## 2022-02-25 DIAGNOSIS — F419 Anxiety disorder, unspecified: Secondary | ICD-10-CM | POA: Diagnosis not present

## 2022-02-25 DIAGNOSIS — K5900 Constipation, unspecified: Secondary | ICD-10-CM | POA: Diagnosis not present

## 2022-02-25 DIAGNOSIS — G5603 Carpal tunnel syndrome, bilateral upper limbs: Secondary | ICD-10-CM | POA: Diagnosis not present

## 2022-03-03 DIAGNOSIS — R198 Other specified symptoms and signs involving the digestive system and abdomen: Secondary | ICD-10-CM | POA: Diagnosis not present

## 2022-03-03 DIAGNOSIS — K5900 Constipation, unspecified: Secondary | ICD-10-CM | POA: Diagnosis not present

## 2022-03-03 DIAGNOSIS — I10 Essential (primary) hypertension: Secondary | ICD-10-CM | POA: Diagnosis not present

## 2022-03-03 DIAGNOSIS — E7800 Pure hypercholesterolemia, unspecified: Secondary | ICD-10-CM | POA: Diagnosis not present

## 2022-03-03 DIAGNOSIS — Z713 Dietary counseling and surveillance: Secondary | ICD-10-CM | POA: Diagnosis not present

## 2022-03-21 ENCOUNTER — Other Ambulatory Visit (HOSPITAL_COMMUNITY): Payer: Self-pay

## 2022-03-21 MED ORDER — FLUTICASONE PROPIONATE 50 MCG/ACT NA SUSP
NASAL | 4 refills | Status: AC
  Filled 2022-03-21: qty 16, 30d supply, fill #0
  Filled 2022-04-20: qty 16, 30d supply, fill #1
  Filled 2022-05-17: qty 16, 30d supply, fill #2
  Filled 2022-08-11: qty 16, 30d supply, fill #3
  Filled 2022-10-05: qty 16, 30d supply, fill #4
  Filled 2022-11-07: qty 16, 30d supply, fill #5
  Filled 2022-12-18: qty 16, 30d supply, fill #6
  Filled 2023-01-23 (×2): qty 16, 30d supply, fill #7
  Filled 2023-03-05: qty 16, 30d supply, fill #8

## 2022-03-22 DIAGNOSIS — K5900 Constipation, unspecified: Secondary | ICD-10-CM | POA: Diagnosis not present

## 2022-03-22 DIAGNOSIS — M6281 Muscle weakness (generalized): Secondary | ICD-10-CM | POA: Diagnosis not present

## 2022-04-12 DIAGNOSIS — K5900 Constipation, unspecified: Secondary | ICD-10-CM | POA: Diagnosis not present

## 2022-04-12 DIAGNOSIS — M6289 Other specified disorders of muscle: Secondary | ICD-10-CM | POA: Diagnosis not present

## 2022-04-12 DIAGNOSIS — M62838 Other muscle spasm: Secondary | ICD-10-CM | POA: Diagnosis not present

## 2022-04-12 DIAGNOSIS — M6281 Muscle weakness (generalized): Secondary | ICD-10-CM | POA: Diagnosis not present

## 2022-04-20 ENCOUNTER — Other Ambulatory Visit (HOSPITAL_COMMUNITY): Payer: Self-pay

## 2022-04-20 DIAGNOSIS — M62838 Other muscle spasm: Secondary | ICD-10-CM | POA: Diagnosis not present

## 2022-04-20 DIAGNOSIS — K5900 Constipation, unspecified: Secondary | ICD-10-CM | POA: Diagnosis not present

## 2022-04-20 DIAGNOSIS — M6289 Other specified disorders of muscle: Secondary | ICD-10-CM | POA: Diagnosis not present

## 2022-04-20 DIAGNOSIS — M6281 Muscle weakness (generalized): Secondary | ICD-10-CM | POA: Diagnosis not present

## 2022-04-20 DIAGNOSIS — N8110 Cystocele, unspecified: Secondary | ICD-10-CM | POA: Diagnosis not present

## 2022-05-09 DIAGNOSIS — K5900 Constipation, unspecified: Secondary | ICD-10-CM | POA: Diagnosis not present

## 2022-05-09 DIAGNOSIS — M62838 Other muscle spasm: Secondary | ICD-10-CM | POA: Diagnosis not present

## 2022-05-09 DIAGNOSIS — M6281 Muscle weakness (generalized): Secondary | ICD-10-CM | POA: Diagnosis not present

## 2022-05-09 DIAGNOSIS — M6289 Other specified disorders of muscle: Secondary | ICD-10-CM | POA: Diagnosis not present

## 2022-05-17 ENCOUNTER — Other Ambulatory Visit (HOSPITAL_COMMUNITY): Payer: Self-pay

## 2022-05-17 DIAGNOSIS — K5900 Constipation, unspecified: Secondary | ICD-10-CM | POA: Diagnosis not present

## 2022-05-17 DIAGNOSIS — M6289 Other specified disorders of muscle: Secondary | ICD-10-CM | POA: Diagnosis not present

## 2022-05-17 DIAGNOSIS — M6281 Muscle weakness (generalized): Secondary | ICD-10-CM | POA: Diagnosis not present

## 2022-05-17 DIAGNOSIS — M62838 Other muscle spasm: Secondary | ICD-10-CM | POA: Diagnosis not present

## 2022-05-17 DIAGNOSIS — N8110 Cystocele, unspecified: Secondary | ICD-10-CM | POA: Diagnosis not present

## 2022-05-18 ENCOUNTER — Other Ambulatory Visit (HOSPITAL_COMMUNITY): Payer: Self-pay

## 2022-05-24 DIAGNOSIS — Z1231 Encounter for screening mammogram for malignant neoplasm of breast: Secondary | ICD-10-CM | POA: Diagnosis not present

## 2022-06-07 DIAGNOSIS — K5900 Constipation, unspecified: Secondary | ICD-10-CM | POA: Diagnosis not present

## 2022-06-07 DIAGNOSIS — M6281 Muscle weakness (generalized): Secondary | ICD-10-CM | POA: Diagnosis not present

## 2022-06-07 DIAGNOSIS — N8110 Cystocele, unspecified: Secondary | ICD-10-CM | POA: Diagnosis not present

## 2022-06-07 DIAGNOSIS — M6289 Other specified disorders of muscle: Secondary | ICD-10-CM | POA: Diagnosis not present

## 2022-06-07 DIAGNOSIS — M62838 Other muscle spasm: Secondary | ICD-10-CM | POA: Diagnosis not present

## 2022-06-15 DIAGNOSIS — I10 Essential (primary) hypertension: Secondary | ICD-10-CM | POA: Diagnosis not present

## 2022-06-15 DIAGNOSIS — Z86010 Personal history of colonic polyps: Secondary | ICD-10-CM | POA: Diagnosis not present

## 2022-06-15 DIAGNOSIS — E7800 Pure hypercholesterolemia, unspecified: Secondary | ICD-10-CM | POA: Diagnosis not present

## 2022-06-15 DIAGNOSIS — Z0000 Encounter for general adult medical examination without abnormal findings: Secondary | ICD-10-CM | POA: Diagnosis not present

## 2022-06-15 DIAGNOSIS — G5603 Carpal tunnel syndrome, bilateral upper limbs: Secondary | ICD-10-CM | POA: Diagnosis not present

## 2022-06-15 DIAGNOSIS — K5901 Slow transit constipation: Secondary | ICD-10-CM | POA: Diagnosis not present

## 2022-06-15 DIAGNOSIS — Z1331 Encounter for screening for depression: Secondary | ICD-10-CM | POA: Diagnosis not present

## 2022-06-23 DIAGNOSIS — K5900 Constipation, unspecified: Secondary | ICD-10-CM | POA: Diagnosis not present

## 2022-06-23 DIAGNOSIS — M62838 Other muscle spasm: Secondary | ICD-10-CM | POA: Diagnosis not present

## 2022-06-23 DIAGNOSIS — M6281 Muscle weakness (generalized): Secondary | ICD-10-CM | POA: Diagnosis not present

## 2022-06-30 ENCOUNTER — Ambulatory Visit
Admission: RE | Admit: 2022-06-30 | Discharge: 2022-06-30 | Disposition: A | Discharge status: Home / Self Care | Payer: PPO | Source: Ambulatory Visit | Attending: Internal Medicine | Admitting: Internal Medicine

## 2022-06-30 DIAGNOSIS — Z780 Asymptomatic menopausal state: Secondary | ICD-10-CM | POA: Diagnosis not present

## 2022-06-30 DIAGNOSIS — M8580 Other specified disorders of bone density and structure, unspecified site: Secondary | ICD-10-CM

## 2022-06-30 DIAGNOSIS — M8589 Other specified disorders of bone density and structure, multiple sites: Secondary | ICD-10-CM | POA: Diagnosis not present

## 2022-07-12 DIAGNOSIS — J329 Chronic sinusitis, unspecified: Secondary | ICD-10-CM | POA: Diagnosis not present

## 2022-07-12 DIAGNOSIS — M62838 Other muscle spasm: Secondary | ICD-10-CM | POA: Diagnosis not present

## 2022-07-12 DIAGNOSIS — M6281 Muscle weakness (generalized): Secondary | ICD-10-CM | POA: Diagnosis not present

## 2022-07-12 DIAGNOSIS — K5900 Constipation, unspecified: Secondary | ICD-10-CM | POA: Diagnosis not present

## 2022-07-12 DIAGNOSIS — M6289 Other specified disorders of muscle: Secondary | ICD-10-CM | POA: Diagnosis not present

## 2022-08-05 DIAGNOSIS — M6281 Muscle weakness (generalized): Secondary | ICD-10-CM | POA: Diagnosis not present

## 2022-08-05 DIAGNOSIS — M6289 Other specified disorders of muscle: Secondary | ICD-10-CM | POA: Diagnosis not present

## 2022-08-05 DIAGNOSIS — M62838 Other muscle spasm: Secondary | ICD-10-CM | POA: Diagnosis not present

## 2022-08-05 DIAGNOSIS — K5900 Constipation, unspecified: Secondary | ICD-10-CM | POA: Diagnosis not present

## 2022-08-11 ENCOUNTER — Other Ambulatory Visit (HOSPITAL_COMMUNITY): Payer: Self-pay

## 2022-08-12 ENCOUNTER — Other Ambulatory Visit (HOSPITAL_COMMUNITY): Payer: Self-pay

## 2022-08-16 DIAGNOSIS — H02831 Dermatochalasis of right upper eyelid: Secondary | ICD-10-CM | POA: Diagnosis not present

## 2022-08-16 DIAGNOSIS — H02834 Dermatochalasis of left upper eyelid: Secondary | ICD-10-CM | POA: Diagnosis not present

## 2022-08-16 DIAGNOSIS — H02403 Unspecified ptosis of bilateral eyelids: Secondary | ICD-10-CM | POA: Diagnosis not present

## 2022-08-18 DIAGNOSIS — L814 Other melanin hyperpigmentation: Secondary | ICD-10-CM | POA: Diagnosis not present

## 2022-08-18 DIAGNOSIS — D225 Melanocytic nevi of trunk: Secondary | ICD-10-CM | POA: Diagnosis not present

## 2022-08-18 DIAGNOSIS — L821 Other seborrheic keratosis: Secondary | ICD-10-CM | POA: Diagnosis not present

## 2022-08-18 DIAGNOSIS — B351 Tinea unguium: Secondary | ICD-10-CM | POA: Diagnosis not present

## 2022-08-18 DIAGNOSIS — L649 Androgenic alopecia, unspecified: Secondary | ICD-10-CM | POA: Diagnosis not present

## 2022-08-18 DIAGNOSIS — L578 Other skin changes due to chronic exposure to nonionizing radiation: Secondary | ICD-10-CM | POA: Diagnosis not present

## 2022-08-18 DIAGNOSIS — D2239 Melanocytic nevi of other parts of face: Secondary | ICD-10-CM | POA: Diagnosis not present

## 2022-08-18 DIAGNOSIS — L739 Follicular disorder, unspecified: Secondary | ICD-10-CM | POA: Diagnosis not present

## 2022-08-18 DIAGNOSIS — Z86018 Personal history of other benign neoplasm: Secondary | ICD-10-CM | POA: Diagnosis not present

## 2022-08-19 ENCOUNTER — Other Ambulatory Visit (HOSPITAL_COMMUNITY): Payer: Self-pay

## 2022-08-19 MED ORDER — TELMISARTAN 40 MG PO TABS
40.0000 mg | ORAL_TABLET | Freq: Every day | ORAL | 3 refills | Status: DC
  Filled 2022-08-19: qty 90, 90d supply, fill #0
  Filled 2022-11-19: qty 90, 90d supply, fill #1
  Filled 2023-02-19: qty 90, 90d supply, fill #2
  Filled 2023-05-19: qty 90, 90d supply, fill #3

## 2022-08-31 DIAGNOSIS — H02831 Dermatochalasis of right upper eyelid: Secondary | ICD-10-CM | POA: Diagnosis not present

## 2022-08-31 DIAGNOSIS — H02403 Unspecified ptosis of bilateral eyelids: Secondary | ICD-10-CM | POA: Diagnosis not present

## 2022-08-31 DIAGNOSIS — H02834 Dermatochalasis of left upper eyelid: Secondary | ICD-10-CM | POA: Diagnosis not present

## 2022-10-07 DIAGNOSIS — G6289 Other specified polyneuropathies: Secondary | ICD-10-CM | POA: Diagnosis not present

## 2022-10-07 DIAGNOSIS — I10 Essential (primary) hypertension: Secondary | ICD-10-CM | POA: Diagnosis not present

## 2022-10-07 DIAGNOSIS — R011 Cardiac murmur, unspecified: Secondary | ICD-10-CM | POA: Diagnosis not present

## 2022-10-07 DIAGNOSIS — E559 Vitamin D deficiency, unspecified: Secondary | ICD-10-CM | POA: Diagnosis not present

## 2022-10-07 DIAGNOSIS — M792 Neuralgia and neuritis, unspecified: Secondary | ICD-10-CM | POA: Diagnosis not present

## 2022-10-10 ENCOUNTER — Other Ambulatory Visit: Payer: Self-pay | Admitting: Internal Medicine

## 2022-10-10 DIAGNOSIS — M792 Neuralgia and neuritis, unspecified: Secondary | ICD-10-CM

## 2022-10-28 DIAGNOSIS — H02831 Dermatochalasis of right upper eyelid: Secondary | ICD-10-CM | POA: Diagnosis not present

## 2022-10-28 DIAGNOSIS — H02413 Mechanical ptosis of bilateral eyelids: Secondary | ICD-10-CM | POA: Diagnosis not present

## 2022-10-28 DIAGNOSIS — H02423 Myogenic ptosis of bilateral eyelids: Secondary | ICD-10-CM | POA: Diagnosis not present

## 2022-10-28 DIAGNOSIS — H0279 Other degenerative disorders of eyelid and periocular area: Secondary | ICD-10-CM | POA: Diagnosis not present

## 2022-10-28 DIAGNOSIS — H57813 Brow ptosis, bilateral: Secondary | ICD-10-CM | POA: Diagnosis not present

## 2022-10-28 DIAGNOSIS — H02834 Dermatochalasis of left upper eyelid: Secondary | ICD-10-CM | POA: Diagnosis not present

## 2022-10-28 DIAGNOSIS — H53483 Generalized contraction of visual field, bilateral: Secondary | ICD-10-CM | POA: Diagnosis not present

## 2022-11-08 ENCOUNTER — Other Ambulatory Visit (HOSPITAL_COMMUNITY): Payer: Self-pay

## 2022-11-18 DIAGNOSIS — B351 Tinea unguium: Secondary | ICD-10-CM | POA: Diagnosis not present

## 2022-11-19 ENCOUNTER — Ambulatory Visit
Admission: RE | Admit: 2022-11-19 | Discharge: 2022-11-19 | Disposition: A | Discharge status: Home / Self Care | Payer: HMO | Source: Ambulatory Visit | Attending: Internal Medicine | Admitting: Internal Medicine

## 2022-11-19 DIAGNOSIS — M792 Neuralgia and neuritis, unspecified: Secondary | ICD-10-CM

## 2022-11-19 DIAGNOSIS — M545 Low back pain, unspecified: Secondary | ICD-10-CM | POA: Diagnosis not present

## 2022-12-14 DIAGNOSIS — I10 Essential (primary) hypertension: Secondary | ICD-10-CM | POA: Diagnosis not present

## 2022-12-14 DIAGNOSIS — M792 Neuralgia and neuritis, unspecified: Secondary | ICD-10-CM | POA: Diagnosis not present

## 2022-12-19 DIAGNOSIS — H02834 Dermatochalasis of left upper eyelid: Secondary | ICD-10-CM | POA: Diagnosis not present

## 2022-12-19 DIAGNOSIS — H02423 Myogenic ptosis of bilateral eyelids: Secondary | ICD-10-CM | POA: Diagnosis not present

## 2022-12-19 DIAGNOSIS — H57813 Brow ptosis, bilateral: Secondary | ICD-10-CM | POA: Diagnosis not present

## 2022-12-19 DIAGNOSIS — H02831 Dermatochalasis of right upper eyelid: Secondary | ICD-10-CM | POA: Diagnosis not present

## 2022-12-19 DIAGNOSIS — H0279 Other degenerative disorders of eyelid and periocular area: Secondary | ICD-10-CM | POA: Diagnosis not present

## 2022-12-19 DIAGNOSIS — H53483 Generalized contraction of visual field, bilateral: Secondary | ICD-10-CM | POA: Diagnosis not present

## 2022-12-19 DIAGNOSIS — H02413 Mechanical ptosis of bilateral eyelids: Secondary | ICD-10-CM | POA: Diagnosis not present

## 2022-12-21 DIAGNOSIS — H903 Sensorineural hearing loss, bilateral: Secondary | ICD-10-CM | POA: Diagnosis not present

## 2022-12-21 DIAGNOSIS — R200 Anesthesia of skin: Secondary | ICD-10-CM | POA: Diagnosis not present

## 2022-12-27 DIAGNOSIS — R200 Anesthesia of skin: Secondary | ICD-10-CM | POA: Diagnosis not present

## 2023-01-10 DIAGNOSIS — Z79899 Other long term (current) drug therapy: Secondary | ICD-10-CM | POA: Diagnosis not present

## 2023-01-10 DIAGNOSIS — B351 Tinea unguium: Secondary | ICD-10-CM | POA: Diagnosis not present

## 2023-01-11 DIAGNOSIS — G5603 Carpal tunnel syndrome, bilateral upper limbs: Secondary | ICD-10-CM | POA: Diagnosis not present

## 2023-01-11 DIAGNOSIS — G5602 Carpal tunnel syndrome, left upper limb: Secondary | ICD-10-CM | POA: Diagnosis not present

## 2023-01-11 DIAGNOSIS — G5601 Carpal tunnel syndrome, right upper limb: Secondary | ICD-10-CM | POA: Diagnosis not present

## 2023-01-16 DIAGNOSIS — H53483 Generalized contraction of visual field, bilateral: Secondary | ICD-10-CM | POA: Diagnosis not present

## 2023-01-20 DIAGNOSIS — G5602 Carpal tunnel syndrome, left upper limb: Secondary | ICD-10-CM | POA: Diagnosis not present

## 2023-01-20 DIAGNOSIS — G5601 Carpal tunnel syndrome, right upper limb: Secondary | ICD-10-CM | POA: Diagnosis not present

## 2023-01-21 DIAGNOSIS — J069 Acute upper respiratory infection, unspecified: Secondary | ICD-10-CM | POA: Diagnosis not present

## 2023-01-23 ENCOUNTER — Other Ambulatory Visit (HOSPITAL_COMMUNITY): Payer: Self-pay

## 2023-01-24 DIAGNOSIS — J40 Bronchitis, not specified as acute or chronic: Secondary | ICD-10-CM | POA: Diagnosis not present

## 2023-02-14 DIAGNOSIS — J301 Allergic rhinitis due to pollen: Secondary | ICD-10-CM | POA: Diagnosis not present

## 2023-02-14 DIAGNOSIS — R059 Cough, unspecified: Secondary | ICD-10-CM | POA: Diagnosis not present

## 2023-02-21 ENCOUNTER — Other Ambulatory Visit (HOSPITAL_COMMUNITY): Payer: Self-pay

## 2023-02-26 DIAGNOSIS — H04123 Dry eye syndrome of bilateral lacrimal glands: Secondary | ICD-10-CM | POA: Diagnosis not present

## 2023-02-26 DIAGNOSIS — R053 Chronic cough: Secondary | ICD-10-CM | POA: Diagnosis not present

## 2023-02-26 DIAGNOSIS — J029 Acute pharyngitis, unspecified: Secondary | ICD-10-CM | POA: Diagnosis not present

## 2023-03-02 DIAGNOSIS — B351 Tinea unguium: Secondary | ICD-10-CM | POA: Diagnosis not present

## 2023-03-02 DIAGNOSIS — L821 Other seborrheic keratosis: Secondary | ICD-10-CM | POA: Diagnosis not present

## 2023-03-02 DIAGNOSIS — L918 Other hypertrophic disorders of the skin: Secondary | ICD-10-CM | POA: Diagnosis not present

## 2023-03-06 ENCOUNTER — Other Ambulatory Visit (HOSPITAL_COMMUNITY): Payer: Self-pay

## 2023-03-22 DIAGNOSIS — M5416 Radiculopathy, lumbar region: Secondary | ICD-10-CM | POA: Diagnosis not present

## 2023-03-31 ENCOUNTER — Other Ambulatory Visit (HOSPITAL_COMMUNITY): Payer: Self-pay

## 2023-04-05 ENCOUNTER — Other Ambulatory Visit (HOSPITAL_COMMUNITY): Payer: Self-pay

## 2023-04-11 DIAGNOSIS — M5416 Radiculopathy, lumbar region: Secondary | ICD-10-CM | POA: Diagnosis not present

## 2023-04-13 DIAGNOSIS — G5601 Carpal tunnel syndrome, right upper limb: Secondary | ICD-10-CM | POA: Diagnosis not present

## 2023-04-17 ENCOUNTER — Other Ambulatory Visit (HOSPITAL_COMMUNITY): Payer: Self-pay

## 2023-04-20 ENCOUNTER — Other Ambulatory Visit (HOSPITAL_COMMUNITY): Payer: Self-pay

## 2023-04-21 ENCOUNTER — Other Ambulatory Visit (HOSPITAL_COMMUNITY): Payer: Self-pay

## 2023-04-21 DIAGNOSIS — Z9889 Other specified postprocedural states: Secondary | ICD-10-CM | POA: Diagnosis not present

## 2023-04-21 DIAGNOSIS — R300 Dysuria: Secondary | ICD-10-CM | POA: Diagnosis not present

## 2023-04-24 ENCOUNTER — Other Ambulatory Visit (HOSPITAL_COMMUNITY): Payer: Self-pay

## 2023-04-24 DIAGNOSIS — R300 Dysuria: Secondary | ICD-10-CM | POA: Diagnosis not present

## 2023-04-25 ENCOUNTER — Other Ambulatory Visit (HOSPITAL_COMMUNITY): Payer: Self-pay

## 2023-04-26 ENCOUNTER — Other Ambulatory Visit (HOSPITAL_COMMUNITY): Payer: Self-pay

## 2023-05-09 DIAGNOSIS — M5416 Radiculopathy, lumbar region: Secondary | ICD-10-CM | POA: Diagnosis not present

## 2023-05-12 DIAGNOSIS — G5601 Carpal tunnel syndrome, right upper limb: Secondary | ICD-10-CM | POA: Diagnosis not present

## 2023-05-16 DIAGNOSIS — R300 Dysuria: Secondary | ICD-10-CM | POA: Diagnosis not present

## 2023-05-16 DIAGNOSIS — N760 Acute vaginitis: Secondary | ICD-10-CM | POA: Diagnosis not present

## 2023-05-22 ENCOUNTER — Other Ambulatory Visit (HOSPITAL_COMMUNITY): Payer: Self-pay

## 2023-05-23 ENCOUNTER — Other Ambulatory Visit (HOSPITAL_COMMUNITY): Payer: Self-pay

## 2023-05-29 DIAGNOSIS — Z1231 Encounter for screening mammogram for malignant neoplasm of breast: Secondary | ICD-10-CM | POA: Diagnosis not present

## 2023-05-29 DIAGNOSIS — Z01419 Encounter for gynecological examination (general) (routine) without abnormal findings: Secondary | ICD-10-CM | POA: Diagnosis not present

## 2023-05-30 ENCOUNTER — Other Ambulatory Visit: Payer: Self-pay | Admitting: Obstetrics and Gynecology

## 2023-05-30 DIAGNOSIS — M8580 Other specified disorders of bone density and structure, unspecified site: Secondary | ICD-10-CM

## 2023-06-06 DIAGNOSIS — M5416 Radiculopathy, lumbar region: Secondary | ICD-10-CM | POA: Diagnosis not present

## 2023-06-19 DIAGNOSIS — K5901 Slow transit constipation: Secondary | ICD-10-CM | POA: Diagnosis not present

## 2023-06-19 DIAGNOSIS — M79671 Pain in right foot: Secondary | ICD-10-CM | POA: Diagnosis not present

## 2023-06-19 DIAGNOSIS — M8589 Other specified disorders of bone density and structure, multiple sites: Secondary | ICD-10-CM | POA: Diagnosis not present

## 2023-06-19 DIAGNOSIS — M792 Neuralgia and neuritis, unspecified: Secondary | ICD-10-CM | POA: Diagnosis not present

## 2023-06-19 DIAGNOSIS — E7800 Pure hypercholesterolemia, unspecified: Secondary | ICD-10-CM | POA: Diagnosis not present

## 2023-06-19 DIAGNOSIS — Z0000 Encounter for general adult medical examination without abnormal findings: Secondary | ICD-10-CM | POA: Diagnosis not present

## 2023-06-19 DIAGNOSIS — Z1331 Encounter for screening for depression: Secondary | ICD-10-CM | POA: Diagnosis not present

## 2023-06-19 DIAGNOSIS — I10 Essential (primary) hypertension: Secondary | ICD-10-CM | POA: Diagnosis not present

## 2023-06-19 DIAGNOSIS — E538 Deficiency of other specified B group vitamins: Secondary | ICD-10-CM | POA: Diagnosis not present

## 2023-06-19 DIAGNOSIS — Z79899 Other long term (current) drug therapy: Secondary | ICD-10-CM | POA: Diagnosis not present

## 2023-07-26 DIAGNOSIS — Z23 Encounter for immunization: Secondary | ICD-10-CM | POA: Diagnosis not present

## 2023-08-14 DIAGNOSIS — G5601 Carpal tunnel syndrome, right upper limb: Secondary | ICD-10-CM | POA: Diagnosis not present

## 2023-08-14 DIAGNOSIS — M25541 Pain in joints of right hand: Secondary | ICD-10-CM | POA: Diagnosis not present

## 2023-08-14 DIAGNOSIS — M1811 Unilateral primary osteoarthritis of first carpometacarpal joint, right hand: Secondary | ICD-10-CM | POA: Diagnosis not present

## 2023-08-18 ENCOUNTER — Other Ambulatory Visit (HOSPITAL_COMMUNITY): Payer: Self-pay

## 2023-08-21 ENCOUNTER — Other Ambulatory Visit (HOSPITAL_COMMUNITY): Payer: Self-pay

## 2023-08-21 DIAGNOSIS — H10412 Chronic giant papillary conjunctivitis, left eye: Secondary | ICD-10-CM | POA: Diagnosis not present

## 2023-08-21 DIAGNOSIS — H02402 Unspecified ptosis of left eyelid: Secondary | ICD-10-CM | POA: Diagnosis not present

## 2023-08-21 MED ORDER — TELMISARTAN 40 MG PO TABS
40.0000 mg | ORAL_TABLET | Freq: Every day | ORAL | 3 refills | Status: DC
  Filled 2023-08-21: qty 90, 90d supply, fill #0
  Filled 2023-11-14: qty 90, 90d supply, fill #1
  Filled 2024-02-12: qty 90, 90d supply, fill #2
  Filled 2024-05-12: qty 90, 90d supply, fill #3

## 2023-08-21 MED ORDER — FLUOROMETHOLONE 0.1 % OP SUSP
OPHTHALMIC | 0 refills | Status: DC
  Filled 2023-08-21 – 2023-08-22 (×2): qty 5, 14d supply, fill #0

## 2023-08-22 ENCOUNTER — Other Ambulatory Visit (HOSPITAL_COMMUNITY): Payer: Self-pay

## 2023-08-22 DIAGNOSIS — D225 Melanocytic nevi of trunk: Secondary | ICD-10-CM | POA: Diagnosis not present

## 2023-08-22 DIAGNOSIS — D485 Neoplasm of uncertain behavior of skin: Secondary | ICD-10-CM | POA: Diagnosis not present

## 2023-08-22 DIAGNOSIS — L578 Other skin changes due to chronic exposure to nonionizing radiation: Secondary | ICD-10-CM | POA: Diagnosis not present

## 2023-08-22 DIAGNOSIS — Z86018 Personal history of other benign neoplasm: Secondary | ICD-10-CM | POA: Diagnosis not present

## 2023-08-22 DIAGNOSIS — B351 Tinea unguium: Secondary | ICD-10-CM | POA: Diagnosis not present

## 2023-08-22 DIAGNOSIS — L821 Other seborrheic keratosis: Secondary | ICD-10-CM | POA: Diagnosis not present

## 2023-08-22 DIAGNOSIS — L720 Epidermal cyst: Secondary | ICD-10-CM | POA: Diagnosis not present

## 2023-08-22 DIAGNOSIS — L814 Other melanin hyperpigmentation: Secondary | ICD-10-CM | POA: Diagnosis not present

## 2023-08-22 MED ORDER — PREDNISOLONE ACETATE 1 % OP SUSP
1.0000 [drp] | OPHTHALMIC | 0 refills | Status: AC
  Filled 2023-08-22 (×2): qty 5, 14d supply, fill #0

## 2023-09-02 ENCOUNTER — Other Ambulatory Visit (HOSPITAL_COMMUNITY): Payer: Self-pay

## 2023-09-20 DIAGNOSIS — E7800 Pure hypercholesterolemia, unspecified: Secondary | ICD-10-CM | POA: Diagnosis not present

## 2023-10-19 DIAGNOSIS — R300 Dysuria: Secondary | ICD-10-CM | POA: Diagnosis not present

## 2023-11-15 ENCOUNTER — Other Ambulatory Visit (HOSPITAL_COMMUNITY): Payer: Self-pay

## 2023-12-05 DIAGNOSIS — H02831 Dermatochalasis of right upper eyelid: Secondary | ICD-10-CM | POA: Diagnosis not present

## 2023-12-05 DIAGNOSIS — H02834 Dermatochalasis of left upper eyelid: Secondary | ICD-10-CM | POA: Diagnosis not present

## 2023-12-05 DIAGNOSIS — H02413 Mechanical ptosis of bilateral eyelids: Secondary | ICD-10-CM | POA: Diagnosis not present

## 2023-12-05 DIAGNOSIS — H0279 Other degenerative disorders of eyelid and periocular area: Secondary | ICD-10-CM | POA: Diagnosis not present

## 2023-12-05 DIAGNOSIS — H53483 Generalized contraction of visual field, bilateral: Secondary | ICD-10-CM | POA: Diagnosis not present

## 2023-12-05 DIAGNOSIS — H57813 Brow ptosis, bilateral: Secondary | ICD-10-CM | POA: Diagnosis not present

## 2023-12-05 DIAGNOSIS — Z01818 Encounter for other preprocedural examination: Secondary | ICD-10-CM | POA: Diagnosis not present

## 2023-12-05 DIAGNOSIS — H02423 Myogenic ptosis of bilateral eyelids: Secondary | ICD-10-CM | POA: Diagnosis not present

## 2023-12-08 DIAGNOSIS — M25552 Pain in left hip: Secondary | ICD-10-CM | POA: Diagnosis not present

## 2023-12-08 DIAGNOSIS — M25562 Pain in left knee: Secondary | ICD-10-CM | POA: Diagnosis not present

## 2023-12-13 DIAGNOSIS — M1712 Unilateral primary osteoarthritis, left knee: Secondary | ICD-10-CM | POA: Diagnosis not present

## 2023-12-13 DIAGNOSIS — M7062 Trochanteric bursitis, left hip: Secondary | ICD-10-CM | POA: Diagnosis not present

## 2023-12-14 DIAGNOSIS — M7062 Trochanteric bursitis, left hip: Secondary | ICD-10-CM | POA: Diagnosis not present

## 2023-12-14 DIAGNOSIS — M1712 Unilateral primary osteoarthritis, left knee: Secondary | ICD-10-CM | POA: Diagnosis not present

## 2023-12-18 DIAGNOSIS — M7062 Trochanteric bursitis, left hip: Secondary | ICD-10-CM | POA: Diagnosis not present

## 2023-12-18 DIAGNOSIS — M1712 Unilateral primary osteoarthritis, left knee: Secondary | ICD-10-CM | POA: Diagnosis not present

## 2023-12-21 DIAGNOSIS — M7062 Trochanteric bursitis, left hip: Secondary | ICD-10-CM | POA: Diagnosis not present

## 2023-12-21 DIAGNOSIS — M1712 Unilateral primary osteoarthritis, left knee: Secondary | ICD-10-CM | POA: Diagnosis not present

## 2023-12-22 DIAGNOSIS — H02834 Dermatochalasis of left upper eyelid: Secondary | ICD-10-CM | POA: Diagnosis not present

## 2023-12-22 DIAGNOSIS — H02831 Dermatochalasis of right upper eyelid: Secondary | ICD-10-CM | POA: Diagnosis not present

## 2023-12-28 DIAGNOSIS — M1712 Unilateral primary osteoarthritis, left knee: Secondary | ICD-10-CM | POA: Diagnosis not present

## 2023-12-28 DIAGNOSIS — M7062 Trochanteric bursitis, left hip: Secondary | ICD-10-CM | POA: Diagnosis not present

## 2024-01-01 DIAGNOSIS — M7062 Trochanteric bursitis, left hip: Secondary | ICD-10-CM | POA: Diagnosis not present

## 2024-01-01 DIAGNOSIS — M1712 Unilateral primary osteoarthritis, left knee: Secondary | ICD-10-CM | POA: Diagnosis not present

## 2024-01-03 DIAGNOSIS — M7062 Trochanteric bursitis, left hip: Secondary | ICD-10-CM | POA: Diagnosis not present

## 2024-01-03 DIAGNOSIS — M1712 Unilateral primary osteoarthritis, left knee: Secondary | ICD-10-CM | POA: Diagnosis not present

## 2024-01-08 DIAGNOSIS — M65342 Trigger finger, left ring finger: Secondary | ICD-10-CM | POA: Diagnosis not present

## 2024-01-09 DIAGNOSIS — M7062 Trochanteric bursitis, left hip: Secondary | ICD-10-CM | POA: Diagnosis not present

## 2024-01-09 DIAGNOSIS — M1712 Unilateral primary osteoarthritis, left knee: Secondary | ICD-10-CM | POA: Diagnosis not present

## 2024-01-11 DIAGNOSIS — M1712 Unilateral primary osteoarthritis, left knee: Secondary | ICD-10-CM | POA: Diagnosis not present

## 2024-01-11 DIAGNOSIS — M7062 Trochanteric bursitis, left hip: Secondary | ICD-10-CM | POA: Diagnosis not present

## 2024-02-15 DIAGNOSIS — R002 Palpitations: Secondary | ICD-10-CM | POA: Diagnosis not present

## 2024-03-05 DIAGNOSIS — H02423 Myogenic ptosis of bilateral eyelids: Secondary | ICD-10-CM | POA: Diagnosis not present

## 2024-03-05 DIAGNOSIS — H02413 Mechanical ptosis of bilateral eyelids: Secondary | ICD-10-CM | POA: Diagnosis not present

## 2024-03-05 DIAGNOSIS — H02834 Dermatochalasis of left upper eyelid: Secondary | ICD-10-CM | POA: Diagnosis not present

## 2024-03-05 DIAGNOSIS — H0279 Other degenerative disorders of eyelid and periocular area: Secondary | ICD-10-CM | POA: Diagnosis not present

## 2024-03-05 DIAGNOSIS — H57813 Brow ptosis, bilateral: Secondary | ICD-10-CM | POA: Diagnosis not present

## 2024-03-05 DIAGNOSIS — H53483 Generalized contraction of visual field, bilateral: Secondary | ICD-10-CM | POA: Diagnosis not present

## 2024-03-05 DIAGNOSIS — H02831 Dermatochalasis of right upper eyelid: Secondary | ICD-10-CM | POA: Diagnosis not present

## 2024-04-01 DIAGNOSIS — R300 Dysuria: Secondary | ICD-10-CM | POA: Diagnosis not present

## 2024-04-09 DIAGNOSIS — R102 Pelvic and perineal pain: Secondary | ICD-10-CM | POA: Diagnosis not present

## 2024-05-24 DIAGNOSIS — J208 Acute bronchitis due to other specified organisms: Secondary | ICD-10-CM | POA: Diagnosis not present

## 2024-05-30 DIAGNOSIS — Z1231 Encounter for screening mammogram for malignant neoplasm of breast: Secondary | ICD-10-CM | POA: Diagnosis not present

## 2024-06-10 DIAGNOSIS — M25512 Pain in left shoulder: Secondary | ICD-10-CM | POA: Diagnosis not present

## 2024-06-10 DIAGNOSIS — M25511 Pain in right shoulder: Secondary | ICD-10-CM | POA: Diagnosis not present

## 2024-06-12 DIAGNOSIS — Z09 Encounter for follow-up examination after completed treatment for conditions other than malignant neoplasm: Secondary | ICD-10-CM | POA: Diagnosis not present

## 2024-06-12 DIAGNOSIS — H57813 Brow ptosis, bilateral: Secondary | ICD-10-CM | POA: Diagnosis not present

## 2024-06-12 DIAGNOSIS — H0279 Other degenerative disorders of eyelid and periocular area: Secondary | ICD-10-CM | POA: Diagnosis not present

## 2024-06-17 DIAGNOSIS — S46012D Strain of muscle(s) and tendon(s) of the rotator cuff of left shoulder, subsequent encounter: Secondary | ICD-10-CM | POA: Diagnosis not present

## 2024-06-17 DIAGNOSIS — S46911D Strain of unspecified muscle, fascia and tendon at shoulder and upper arm level, right arm, subsequent encounter: Secondary | ICD-10-CM | POA: Diagnosis not present

## 2024-06-19 DIAGNOSIS — J988 Other specified respiratory disorders: Secondary | ICD-10-CM | POA: Diagnosis not present

## 2024-07-01 DIAGNOSIS — R059 Cough, unspecified: Secondary | ICD-10-CM | POA: Diagnosis not present

## 2024-07-08 ENCOUNTER — Other Ambulatory Visit (HOSPITAL_COMMUNITY): Payer: Self-pay

## 2024-07-10 DIAGNOSIS — R300 Dysuria: Secondary | ICD-10-CM | POA: Diagnosis not present

## 2024-07-12 ENCOUNTER — Other Ambulatory Visit (HOSPITAL_COMMUNITY): Payer: Self-pay

## 2024-07-15 DIAGNOSIS — J301 Allergic rhinitis due to pollen: Secondary | ICD-10-CM | POA: Diagnosis not present

## 2024-07-24 DIAGNOSIS — N399 Disorder of urinary system, unspecified: Secondary | ICD-10-CM | POA: Diagnosis not present

## 2024-07-24 DIAGNOSIS — R8290 Unspecified abnormal findings in urine: Secondary | ICD-10-CM | POA: Diagnosis not present

## 2024-08-01 ENCOUNTER — Other Ambulatory Visit (HOSPITAL_COMMUNITY): Payer: Self-pay

## 2024-08-06 ENCOUNTER — Other Ambulatory Visit (HOSPITAL_COMMUNITY): Payer: Self-pay

## 2024-08-06 MED ORDER — TELMISARTAN 40 MG PO TABS
40.0000 mg | ORAL_TABLET | Freq: Every day | ORAL | 3 refills | Status: AC
  Filled 2024-08-06 – 2024-08-09 (×2): qty 90, 90d supply, fill #0
  Filled 2024-11-06: qty 90, 90d supply, fill #1

## 2024-08-09 ENCOUNTER — Other Ambulatory Visit (HOSPITAL_COMMUNITY): Payer: Self-pay

## 2024-08-09 ENCOUNTER — Other Ambulatory Visit: Payer: Self-pay

## 2024-08-20 ENCOUNTER — Other Ambulatory Visit (HOSPITAL_BASED_OUTPATIENT_CLINIC_OR_DEPARTMENT_OTHER): Payer: Self-pay | Admitting: Internal Medicine

## 2024-08-20 DIAGNOSIS — K589 Irritable bowel syndrome without diarrhea: Secondary | ICD-10-CM | POA: Diagnosis not present

## 2024-08-20 DIAGNOSIS — Z79899 Other long term (current) drug therapy: Secondary | ICD-10-CM | POA: Diagnosis not present

## 2024-08-20 DIAGNOSIS — E538 Deficiency of other specified B group vitamins: Secondary | ICD-10-CM | POA: Diagnosis not present

## 2024-08-20 DIAGNOSIS — J3089 Other allergic rhinitis: Secondary | ICD-10-CM | POA: Diagnosis not present

## 2024-08-20 DIAGNOSIS — I10 Essential (primary) hypertension: Secondary | ICD-10-CM | POA: Diagnosis not present

## 2024-08-20 DIAGNOSIS — Z0000 Encounter for general adult medical examination without abnormal findings: Secondary | ICD-10-CM | POA: Diagnosis not present

## 2024-08-20 DIAGNOSIS — M8589 Other specified disorders of bone density and structure, multiple sites: Secondary | ICD-10-CM

## 2024-08-20 DIAGNOSIS — E7800 Pure hypercholesterolemia, unspecified: Secondary | ICD-10-CM | POA: Diagnosis not present

## 2024-08-22 DIAGNOSIS — H5213 Myopia, bilateral: Secondary | ICD-10-CM | POA: Diagnosis not present

## 2024-08-22 DIAGNOSIS — H52203 Unspecified astigmatism, bilateral: Secondary | ICD-10-CM | POA: Diagnosis not present

## 2024-08-22 DIAGNOSIS — H02403 Unspecified ptosis of bilateral eyelids: Secondary | ICD-10-CM | POA: Diagnosis not present

## 2024-08-25 DIAGNOSIS — Z23 Encounter for immunization: Secondary | ICD-10-CM | POA: Diagnosis not present

## 2024-09-04 DIAGNOSIS — J310 Chronic rhinitis: Secondary | ICD-10-CM | POA: Diagnosis not present

## 2024-09-04 DIAGNOSIS — R052 Subacute cough: Secondary | ICD-10-CM | POA: Diagnosis not present

## 2024-09-27 ENCOUNTER — Other Ambulatory Visit (HOSPITAL_COMMUNITY): Payer: Self-pay
